# Patient Record
Sex: Female | Born: 1945 | Race: White | Hispanic: No | Smoking: Former smoker
Health system: Southern US, Community
[De-identification: ages and names within clinical notes are randomized; demographics above are authoritative.]

## PROBLEM LIST (undated history)

## (undated) DIAGNOSIS — I10 Essential (primary) hypertension: Secondary | ICD-10-CM

## (undated) DIAGNOSIS — C801 Malignant (primary) neoplasm, unspecified: Secondary | ICD-10-CM

## (undated) DIAGNOSIS — I2510 Atherosclerotic heart disease of native coronary artery without angina pectoris: Secondary | ICD-10-CM

## (undated) DIAGNOSIS — J449 Chronic obstructive pulmonary disease, unspecified: Secondary | ICD-10-CM

## (undated) HISTORY — PX: ABDOMINAL HYSTERECTOMY: SHX81

## (undated) HISTORY — PX: EYE SURGERY: SHX253

## (undated) HISTORY — PX: TONSILLECTOMY: SUR1361

## (undated) HISTORY — PX: VULVECTOMY PARTIAL: SHX6187

## (undated) HISTORY — PX: COLONOSCOPY: SHX174

## (undated) HISTORY — PX: KNEE ARTHROSCOPY: SHX127

## (undated) HISTORY — PX: TUBAL LIGATION: SHX77

## (undated) HISTORY — PX: ESOPHAGOGASTRODUODENOSCOPY: SHX1529

---

## 2004-08-17 ENCOUNTER — Ambulatory Visit: Payer: Self-pay | Admitting: Internal Medicine

## 2005-08-24 ENCOUNTER — Ambulatory Visit: Payer: Self-pay | Admitting: Internal Medicine

## 2006-08-25 ENCOUNTER — Ambulatory Visit: Payer: Self-pay | Admitting: Internal Medicine

## 2007-08-31 ENCOUNTER — Ambulatory Visit: Payer: Self-pay | Admitting: Internal Medicine

## 2008-06-02 ENCOUNTER — Ambulatory Visit: Payer: Self-pay | Admitting: Orthopedic Surgery

## 2008-08-15 ENCOUNTER — Ambulatory Visit: Payer: Self-pay | Admitting: Orthopedic Surgery

## 2008-08-15 ENCOUNTER — Ambulatory Visit: Payer: Self-pay | Admitting: Cardiology

## 2008-09-09 ENCOUNTER — Ambulatory Visit: Payer: Self-pay | Admitting: Internal Medicine

## 2008-09-18 ENCOUNTER — Ambulatory Visit: Payer: Self-pay | Admitting: Orthopedic Surgery

## 2008-12-04 ENCOUNTER — Ambulatory Visit: Payer: Self-pay | Admitting: Orthopedic Surgery

## 2010-03-10 ENCOUNTER — Ambulatory Visit: Payer: Self-pay | Admitting: Internal Medicine

## 2010-03-10 ENCOUNTER — Ambulatory Visit: Payer: Self-pay | Admitting: Gynecologic Oncology

## 2010-03-30 ENCOUNTER — Ambulatory Visit: Payer: Self-pay | Admitting: Gynecologic Oncology

## 2010-04-09 ENCOUNTER — Ambulatory Visit: Payer: Self-pay | Admitting: Gynecologic Oncology

## 2011-05-10 ENCOUNTER — Ambulatory Visit: Payer: Self-pay | Admitting: Gynecologic Oncology

## 2011-05-11 ENCOUNTER — Ambulatory Visit: Payer: Self-pay | Admitting: Gynecologic Oncology

## 2011-07-20 ENCOUNTER — Ambulatory Visit: Payer: Self-pay | Admitting: Internal Medicine

## 2012-05-22 ENCOUNTER — Ambulatory Visit: Payer: Self-pay | Admitting: Gynecologic Oncology

## 2012-06-10 ENCOUNTER — Ambulatory Visit: Payer: Self-pay | Admitting: Gynecologic Oncology

## 2012-08-14 ENCOUNTER — Ambulatory Visit: Payer: Self-pay | Admitting: Internal Medicine

## 2012-12-08 ENCOUNTER — Ambulatory Visit: Payer: Self-pay | Admitting: Gynecologic Oncology

## 2012-12-28 ENCOUNTER — Ambulatory Visit: Payer: Self-pay | Admitting: Unknown Physician Specialty

## 2013-01-01 LAB — PATHOLOGY REPORT

## 2013-01-08 ENCOUNTER — Ambulatory Visit: Payer: Self-pay | Admitting: Gynecologic Oncology

## 2013-05-23 ENCOUNTER — Ambulatory Visit: Payer: Self-pay | Admitting: Gynecologic Oncology

## 2013-06-11 ENCOUNTER — Ambulatory Visit: Payer: Self-pay | Admitting: Hematology and Oncology

## 2013-07-10 ENCOUNTER — Emergency Department: Payer: Self-pay | Admitting: Emergency Medicine

## 2013-07-10 ENCOUNTER — Ambulatory Visit: Payer: Self-pay | Admitting: Hematology and Oncology

## 2013-07-10 LAB — BASIC METABOLIC PANEL
BUN: 8 mg/dL (ref 7–18)
Co2: 29 mmol/L (ref 21–32)
Creatinine: 0.69 mg/dL (ref 0.60–1.30)
EGFR (African American): >60
Glucose: 135 mg/dL — ABNORMAL HIGH (ref 65–99)
Potassium: 3.5 mmol/L (ref 3.5–5.1)
Sodium: 132 mmol/L — ABNORMAL LOW (ref 136–145)

## 2013-07-10 LAB — CBC
HGB: 15.1 g/dL (ref 12.0–16.0)
MCH: 30.9 pg (ref 26.0–34.0)
MCHC: 34.6 g/dL (ref 32.0–36.0)
Platelet: 332 10*3/uL (ref 150–440)
RBC: 4.91 10*6/uL (ref 3.80–5.20)
WBC: 10 10*3/uL (ref 3.6–11.0)

## 2013-07-10 LAB — CK TOTAL AND CKMB (NOT AT ARMC): CK-MB: <0.5 ng/mL — ABNORMAL LOW (ref 0.5–3.6)

## 2013-08-02 ENCOUNTER — Ambulatory Visit: Payer: Self-pay | Admitting: Internal Medicine

## 2013-09-17 ENCOUNTER — Ambulatory Visit: Payer: Self-pay | Admitting: Internal Medicine

## 2014-01-18 DIAGNOSIS — F419 Anxiety disorder, unspecified: Secondary | ICD-10-CM | POA: Insufficient documentation

## 2014-01-18 DIAGNOSIS — C52 Malignant neoplasm of vagina: Secondary | ICD-10-CM | POA: Insufficient documentation

## 2014-01-18 DIAGNOSIS — I10 Essential (primary) hypertension: Secondary | ICD-10-CM | POA: Insufficient documentation

## 2014-01-18 DIAGNOSIS — R739 Hyperglycemia, unspecified: Secondary | ICD-10-CM | POA: Insufficient documentation

## 2014-02-26 DIAGNOSIS — M1990 Unspecified osteoarthritis, unspecified site: Secondary | ICD-10-CM | POA: Insufficient documentation

## 2014-06-10 ENCOUNTER — Ambulatory Visit: Payer: Self-pay | Admitting: Family Medicine

## 2014-07-10 ENCOUNTER — Ambulatory Visit: Payer: Self-pay | Admitting: Family Medicine

## 2014-11-18 ENCOUNTER — Ambulatory Visit: Payer: Self-pay | Admitting: Internal Medicine

## 2014-11-18 DIAGNOSIS — Z1231 Encounter for screening mammogram for malignant neoplasm of breast: Secondary | ICD-10-CM | POA: Diagnosis not present

## 2014-11-18 DIAGNOSIS — R928 Other abnormal and inconclusive findings on diagnostic imaging of breast: Secondary | ICD-10-CM | POA: Diagnosis not present

## 2014-11-19 ENCOUNTER — Ambulatory Visit: Payer: Self-pay | Admitting: Internal Medicine

## 2014-11-19 DIAGNOSIS — N63 Unspecified lump in breast: Secondary | ICD-10-CM | POA: Diagnosis not present

## 2014-11-19 DIAGNOSIS — R922 Inconclusive mammogram: Secondary | ICD-10-CM | POA: Diagnosis not present

## 2014-11-19 DIAGNOSIS — R928 Other abnormal and inconclusive findings on diagnostic imaging of breast: Secondary | ICD-10-CM | POA: Diagnosis not present

## 2015-01-09 DIAGNOSIS — M4697 Unspecified inflammatory spondylopathy, lumbosacral region: Secondary | ICD-10-CM | POA: Diagnosis not present

## 2015-01-09 DIAGNOSIS — M545 Low back pain: Secondary | ICD-10-CM | POA: Diagnosis not present

## 2015-01-13 DIAGNOSIS — M461 Sacroiliitis, not elsewhere classified: Secondary | ICD-10-CM | POA: Diagnosis not present

## 2015-01-13 DIAGNOSIS — M1991 Primary osteoarthritis, unspecified site: Secondary | ICD-10-CM | POA: Diagnosis not present

## 2015-01-13 DIAGNOSIS — M545 Low back pain: Secondary | ICD-10-CM | POA: Diagnosis not present

## 2015-01-15 DIAGNOSIS — M461 Sacroiliitis, not elsewhere classified: Secondary | ICD-10-CM | POA: Diagnosis not present

## 2015-01-15 DIAGNOSIS — M1991 Primary osteoarthritis, unspecified site: Secondary | ICD-10-CM | POA: Diagnosis not present

## 2015-01-15 DIAGNOSIS — M545 Low back pain: Secondary | ICD-10-CM | POA: Diagnosis not present

## 2015-01-20 DIAGNOSIS — M1991 Primary osteoarthritis, unspecified site: Secondary | ICD-10-CM | POA: Diagnosis not present

## 2015-01-20 DIAGNOSIS — M461 Sacroiliitis, not elsewhere classified: Secondary | ICD-10-CM | POA: Diagnosis not present

## 2015-01-20 DIAGNOSIS — I10 Essential (primary) hypertension: Secondary | ICD-10-CM | POA: Diagnosis not present

## 2015-01-20 DIAGNOSIS — M545 Low back pain: Secondary | ICD-10-CM | POA: Diagnosis not present

## 2015-01-22 DIAGNOSIS — M461 Sacroiliitis, not elsewhere classified: Secondary | ICD-10-CM | POA: Diagnosis not present

## 2015-01-22 DIAGNOSIS — M545 Low back pain: Secondary | ICD-10-CM | POA: Diagnosis not present

## 2015-01-22 DIAGNOSIS — M1991 Primary osteoarthritis, unspecified site: Secondary | ICD-10-CM | POA: Diagnosis not present

## 2015-01-27 DIAGNOSIS — M461 Sacroiliitis, not elsewhere classified: Secondary | ICD-10-CM | POA: Diagnosis not present

## 2015-01-27 DIAGNOSIS — M545 Low back pain: Secondary | ICD-10-CM | POA: Diagnosis not present

## 2015-01-27 DIAGNOSIS — M1991 Primary osteoarthritis, unspecified site: Secondary | ICD-10-CM | POA: Diagnosis not present

## 2015-01-29 DIAGNOSIS — M461 Sacroiliitis, not elsewhere classified: Secondary | ICD-10-CM | POA: Diagnosis not present

## 2015-01-29 DIAGNOSIS — M545 Low back pain: Secondary | ICD-10-CM | POA: Diagnosis not present

## 2015-01-29 DIAGNOSIS — M1991 Primary osteoarthritis, unspecified site: Secondary | ICD-10-CM | POA: Diagnosis not present

## 2015-02-03 DIAGNOSIS — M545 Low back pain: Secondary | ICD-10-CM | POA: Diagnosis not present

## 2015-02-03 DIAGNOSIS — M461 Sacroiliitis, not elsewhere classified: Secondary | ICD-10-CM | POA: Diagnosis not present

## 2015-02-03 DIAGNOSIS — M1991 Primary osteoarthritis, unspecified site: Secondary | ICD-10-CM | POA: Diagnosis not present

## 2015-02-05 DIAGNOSIS — M545 Low back pain: Secondary | ICD-10-CM | POA: Diagnosis not present

## 2015-02-05 DIAGNOSIS — M461 Sacroiliitis, not elsewhere classified: Secondary | ICD-10-CM | POA: Diagnosis not present

## 2015-02-05 DIAGNOSIS — M1991 Primary osteoarthritis, unspecified site: Secondary | ICD-10-CM | POA: Diagnosis not present

## 2015-02-09 DIAGNOSIS — M545 Low back pain: Secondary | ICD-10-CM | POA: Diagnosis not present

## 2015-02-09 DIAGNOSIS — M461 Sacroiliitis, not elsewhere classified: Secondary | ICD-10-CM | POA: Diagnosis not present

## 2015-02-09 DIAGNOSIS — M1991 Primary osteoarthritis, unspecified site: Secondary | ICD-10-CM | POA: Diagnosis not present

## 2015-02-11 DIAGNOSIS — M545 Low back pain: Secondary | ICD-10-CM | POA: Diagnosis not present

## 2015-02-11 DIAGNOSIS — M1991 Primary osteoarthritis, unspecified site: Secondary | ICD-10-CM | POA: Diagnosis not present

## 2015-02-11 DIAGNOSIS — M461 Sacroiliitis, not elsewhere classified: Secondary | ICD-10-CM | POA: Diagnosis not present

## 2015-02-17 DIAGNOSIS — M1991 Primary osteoarthritis, unspecified site: Secondary | ICD-10-CM | POA: Diagnosis not present

## 2015-02-17 DIAGNOSIS — M461 Sacroiliitis, not elsewhere classified: Secondary | ICD-10-CM | POA: Diagnosis not present

## 2015-02-17 DIAGNOSIS — M545 Low back pain: Secondary | ICD-10-CM | POA: Diagnosis not present

## 2015-02-19 DIAGNOSIS — M545 Low back pain: Secondary | ICD-10-CM | POA: Diagnosis not present

## 2015-02-19 DIAGNOSIS — M461 Sacroiliitis, not elsewhere classified: Secondary | ICD-10-CM | POA: Diagnosis not present

## 2015-02-19 DIAGNOSIS — M1991 Primary osteoarthritis, unspecified site: Secondary | ICD-10-CM | POA: Diagnosis not present

## 2015-02-24 DIAGNOSIS — M461 Sacroiliitis, not elsewhere classified: Secondary | ICD-10-CM | POA: Diagnosis not present

## 2015-02-24 DIAGNOSIS — M1991 Primary osteoarthritis, unspecified site: Secondary | ICD-10-CM | POA: Diagnosis not present

## 2015-02-24 DIAGNOSIS — M545 Low back pain: Secondary | ICD-10-CM | POA: Diagnosis not present

## 2015-02-26 DIAGNOSIS — R739 Hyperglycemia, unspecified: Secondary | ICD-10-CM | POA: Diagnosis not present

## 2015-02-26 DIAGNOSIS — M461 Sacroiliitis, not elsewhere classified: Secondary | ICD-10-CM | POA: Diagnosis not present

## 2015-02-26 DIAGNOSIS — F419 Anxiety disorder, unspecified: Secondary | ICD-10-CM | POA: Diagnosis not present

## 2015-02-26 DIAGNOSIS — M1991 Primary osteoarthritis, unspecified site: Secondary | ICD-10-CM | POA: Diagnosis not present

## 2015-02-26 DIAGNOSIS — I10 Essential (primary) hypertension: Secondary | ICD-10-CM | POA: Diagnosis not present

## 2015-02-26 DIAGNOSIS — M545 Low back pain: Secondary | ICD-10-CM | POA: Diagnosis not present

## 2015-02-26 DIAGNOSIS — J42 Unspecified chronic bronchitis: Secondary | ICD-10-CM | POA: Diagnosis not present

## 2015-08-20 DIAGNOSIS — R739 Hyperglycemia, unspecified: Secondary | ICD-10-CM | POA: Diagnosis not present

## 2015-08-20 DIAGNOSIS — I10 Essential (primary) hypertension: Secondary | ICD-10-CM | POA: Diagnosis not present

## 2015-08-27 DIAGNOSIS — Z0000 Encounter for general adult medical examination without abnormal findings: Secondary | ICD-10-CM | POA: Diagnosis not present

## 2015-08-27 DIAGNOSIS — Z1231 Encounter for screening mammogram for malignant neoplasm of breast: Secondary | ICD-10-CM | POA: Diagnosis not present

## 2015-08-27 DIAGNOSIS — I10 Essential (primary) hypertension: Secondary | ICD-10-CM | POA: Diagnosis not present

## 2015-08-27 DIAGNOSIS — J42 Unspecified chronic bronchitis: Secondary | ICD-10-CM | POA: Diagnosis not present

## 2015-08-27 DIAGNOSIS — Z23 Encounter for immunization: Secondary | ICD-10-CM | POA: Diagnosis not present

## 2015-08-27 DIAGNOSIS — R739 Hyperglycemia, unspecified: Secondary | ICD-10-CM | POA: Diagnosis not present

## 2016-02-18 DIAGNOSIS — I10 Essential (primary) hypertension: Secondary | ICD-10-CM | POA: Diagnosis not present

## 2016-02-18 DIAGNOSIS — R739 Hyperglycemia, unspecified: Secondary | ICD-10-CM | POA: Diagnosis not present

## 2016-02-24 DIAGNOSIS — I10 Essential (primary) hypertension: Secondary | ICD-10-CM | POA: Diagnosis not present

## 2016-02-24 DIAGNOSIS — R739 Hyperglycemia, unspecified: Secondary | ICD-10-CM | POA: Diagnosis not present

## 2016-02-24 DIAGNOSIS — Z1231 Encounter for screening mammogram for malignant neoplasm of breast: Secondary | ICD-10-CM | POA: Diagnosis not present

## 2016-02-24 DIAGNOSIS — M1990 Unspecified osteoarthritis, unspecified site: Secondary | ICD-10-CM | POA: Diagnosis not present

## 2016-02-24 DIAGNOSIS — J42 Unspecified chronic bronchitis: Secondary | ICD-10-CM | POA: Diagnosis not present

## 2016-03-15 ENCOUNTER — Other Ambulatory Visit: Payer: Self-pay | Admitting: Internal Medicine

## 2016-03-15 DIAGNOSIS — Z1231 Encounter for screening mammogram for malignant neoplasm of breast: Secondary | ICD-10-CM

## 2016-03-23 ENCOUNTER — Ambulatory Visit
Admission: RE | Admit: 2016-03-23 | Discharge: 2016-03-23 | Disposition: A | Discharge status: Home / Self Care | Payer: Commercial Managed Care - HMO | Source: Ambulatory Visit | Attending: Internal Medicine | Admitting: Internal Medicine

## 2016-03-23 DIAGNOSIS — Z1231 Encounter for screening mammogram for malignant neoplasm of breast: Secondary | ICD-10-CM | POA: Insufficient documentation

## 2016-06-29 DIAGNOSIS — Z23 Encounter for immunization: Secondary | ICD-10-CM | POA: Diagnosis not present

## 2016-06-29 DIAGNOSIS — M79632 Pain in left forearm: Secondary | ICD-10-CM | POA: Diagnosis not present

## 2016-06-29 DIAGNOSIS — M67431 Ganglion, right wrist: Secondary | ICD-10-CM | POA: Diagnosis not present

## 2016-08-31 DIAGNOSIS — L821 Other seborrheic keratosis: Secondary | ICD-10-CM | POA: Diagnosis not present

## 2016-08-31 DIAGNOSIS — J42 Unspecified chronic bronchitis: Secondary | ICD-10-CM | POA: Diagnosis not present

## 2016-08-31 DIAGNOSIS — Z0000 Encounter for general adult medical examination without abnormal findings: Secondary | ICD-10-CM | POA: Diagnosis not present

## 2016-08-31 DIAGNOSIS — I10 Essential (primary) hypertension: Secondary | ICD-10-CM | POA: Diagnosis not present

## 2016-08-31 DIAGNOSIS — E7800 Pure hypercholesterolemia, unspecified: Secondary | ICD-10-CM | POA: Diagnosis not present

## 2016-09-09 ENCOUNTER — Telehealth: Payer: Self-pay | Admitting: *Deleted

## 2016-09-09 DIAGNOSIS — E7800 Pure hypercholesterolemia, unspecified: Secondary | ICD-10-CM | POA: Diagnosis not present

## 2016-09-09 DIAGNOSIS — Z0000 Encounter for general adult medical examination without abnormal findings: Secondary | ICD-10-CM | POA: Diagnosis not present

## 2016-09-09 DIAGNOSIS — I10 Essential (primary) hypertension: Secondary | ICD-10-CM | POA: Diagnosis not present

## 2016-09-09 DIAGNOSIS — R7309 Other abnormal glucose: Secondary | ICD-10-CM | POA: Diagnosis not present

## 2016-09-09 NOTE — Telephone Encounter (Signed)
Received referral for initial lung cancer screening scan. Contacted patient and obtained smoking history,(former, quit 03/2011, 50 pack year) as well as answering questions related to screening process. Patient denies signs of lung cancer such as weight loss or hemoptysis. Patient denies comorbidity that would prevent curative treatment if lung cancer were found. Patient is tentatively scheduled for shared decision making visit and CT scan on 09/20/16 at 1:30pm, pending insurance approval from business office.

## 2016-09-19 ENCOUNTER — Other Ambulatory Visit: Payer: Self-pay | Admitting: *Deleted

## 2016-09-19 DIAGNOSIS — Z87891 Personal history of nicotine dependence: Secondary | ICD-10-CM

## 2016-09-20 ENCOUNTER — Encounter: Payer: Self-pay | Admitting: Oncology

## 2016-09-20 ENCOUNTER — Ambulatory Visit: Payer: Commercial Managed Care - HMO | Admitting: Oncology

## 2016-09-20 ENCOUNTER — Inpatient Hospital Stay: Payer: Commercial Managed Care - HMO | Attending: Oncology | Admitting: Oncology

## 2016-09-20 ENCOUNTER — Ambulatory Visit
Admission: RE | Admit: 2016-09-20 | Discharge: 2016-09-20 | Disposition: A | Discharge status: Home / Self Care | Payer: Commercial Managed Care - HMO | Source: Ambulatory Visit | Attending: Oncology | Admitting: Oncology

## 2016-09-20 DIAGNOSIS — Z87891 Personal history of nicotine dependence: Secondary | ICD-10-CM | POA: Insufficient documentation

## 2016-09-20 DIAGNOSIS — Z122 Encounter for screening for malignant neoplasm of respiratory organs: Secondary | ICD-10-CM

## 2016-09-21 ENCOUNTER — Encounter: Payer: Self-pay | Admitting: *Deleted

## 2016-09-23 DIAGNOSIS — Z87891 Personal history of nicotine dependence: Secondary | ICD-10-CM | POA: Insufficient documentation

## 2016-09-23 NOTE — Progress Notes (Signed)
In accordance with CMS guidelines, patient has met eligibility criteria including age, absence of signs or symptoms of lung cancer.  Social History  Substance Use Topics  . Smoking status: Former Smoker    Packs/day: 1.00    Years: 50.00    Types: Cigarettes    Quit date: 2012  . Smokeless tobacco: Not on file  . Alcohol use Not on file     A shared decision-making session was conducted prior to the performance of CT scan. This includes one or more decision aids, includes benefits and harms of screening, follow-up diagnostic testing, over-diagnosis, false positive rate, and total radiation exposure.  Counseling on the importance of adherence to annual lung cancer LDCT screening, impact of co-morbidities, and ability or willingness to undergo diagnosis and treatment is imperative for compliance of the program.  Counseling on the importance of continued smoking cessation for former smokers; the importance of smoking cessation for current smokers, and information about tobacco cessation interventions have been given to patient including [facility] and [facility] quit [place] programs.  Written order for lung cancer screening with LDCT has been given to the patient and any and all questions have been answered to the best of my abilities.   Yearly follow up will be coordinated by Burgess Estelle, Thoracic Navigator.

## 2016-10-06 DIAGNOSIS — L570 Actinic keratosis: Secondary | ICD-10-CM | POA: Diagnosis not present

## 2016-10-06 DIAGNOSIS — X32XXXA Exposure to sunlight, initial encounter: Secondary | ICD-10-CM | POA: Diagnosis not present

## 2016-10-06 DIAGNOSIS — L821 Other seborrheic keratosis: Secondary | ICD-10-CM | POA: Diagnosis not present

## 2016-10-06 DIAGNOSIS — Z08 Encounter for follow-up examination after completed treatment for malignant neoplasm: Secondary | ICD-10-CM | POA: Diagnosis not present

## 2016-10-06 DIAGNOSIS — Z85828 Personal history of other malignant neoplasm of skin: Secondary | ICD-10-CM | POA: Diagnosis not present

## 2016-11-15 DIAGNOSIS — R7309 Other abnormal glucose: Secondary | ICD-10-CM | POA: Insufficient documentation

## 2016-11-15 DIAGNOSIS — I2510 Atherosclerotic heart disease of native coronary artery without angina pectoris: Secondary | ICD-10-CM | POA: Diagnosis not present

## 2017-02-22 DIAGNOSIS — I2510 Atherosclerotic heart disease of native coronary artery without angina pectoris: Secondary | ICD-10-CM | POA: Diagnosis not present

## 2017-02-22 DIAGNOSIS — R7309 Other abnormal glucose: Secondary | ICD-10-CM | POA: Diagnosis not present

## 2017-03-01 DIAGNOSIS — J42 Unspecified chronic bronchitis: Secondary | ICD-10-CM | POA: Diagnosis not present

## 2017-03-01 DIAGNOSIS — R202 Paresthesia of skin: Secondary | ICD-10-CM | POA: Diagnosis not present

## 2017-03-01 DIAGNOSIS — I2510 Atherosclerotic heart disease of native coronary artery without angina pectoris: Secondary | ICD-10-CM | POA: Diagnosis not present

## 2017-03-01 DIAGNOSIS — C52 Malignant neoplasm of vagina: Secondary | ICD-10-CM | POA: Diagnosis not present

## 2017-03-01 DIAGNOSIS — R7309 Other abnormal glucose: Secondary | ICD-10-CM | POA: Diagnosis not present

## 2017-03-01 DIAGNOSIS — I10 Essential (primary) hypertension: Secondary | ICD-10-CM | POA: Diagnosis not present

## 2017-04-06 DIAGNOSIS — D2272 Melanocytic nevi of left lower limb, including hip: Secondary | ICD-10-CM | POA: Diagnosis not present

## 2017-04-06 DIAGNOSIS — D2262 Melanocytic nevi of left upper limb, including shoulder: Secondary | ICD-10-CM | POA: Diagnosis not present

## 2017-04-06 DIAGNOSIS — D2271 Melanocytic nevi of right lower limb, including hip: Secondary | ICD-10-CM | POA: Diagnosis not present

## 2017-04-06 DIAGNOSIS — D2261 Melanocytic nevi of right upper limb, including shoulder: Secondary | ICD-10-CM | POA: Diagnosis not present

## 2017-04-07 ENCOUNTER — Other Ambulatory Visit: Payer: Self-pay | Admitting: Internal Medicine

## 2017-04-07 DIAGNOSIS — Z1231 Encounter for screening mammogram for malignant neoplasm of breast: Secondary | ICD-10-CM

## 2017-04-19 DIAGNOSIS — M898X6 Other specified disorders of bone, lower leg: Secondary | ICD-10-CM | POA: Diagnosis not present

## 2017-04-19 DIAGNOSIS — M2391 Unspecified internal derangement of right knee: Secondary | ICD-10-CM | POA: Diagnosis not present

## 2017-04-19 DIAGNOSIS — M5441 Lumbago with sciatica, right side: Secondary | ICD-10-CM | POA: Diagnosis not present

## 2017-05-02 ENCOUNTER — Ambulatory Visit
Admission: RE | Admit: 2017-05-02 | Discharge: 2017-05-02 | Disposition: A | Discharge status: Home / Self Care | Payer: Commercial Managed Care - HMO | Source: Ambulatory Visit | Attending: Internal Medicine | Admitting: Internal Medicine

## 2017-05-02 DIAGNOSIS — Z1231 Encounter for screening mammogram for malignant neoplasm of breast: Secondary | ICD-10-CM | POA: Diagnosis not present

## 2017-08-29 DIAGNOSIS — R202 Paresthesia of skin: Secondary | ICD-10-CM | POA: Diagnosis not present

## 2017-08-29 DIAGNOSIS — I10 Essential (primary) hypertension: Secondary | ICD-10-CM | POA: Diagnosis not present

## 2017-08-29 DIAGNOSIS — R7309 Other abnormal glucose: Secondary | ICD-10-CM | POA: Diagnosis not present

## 2017-08-29 DIAGNOSIS — I2510 Atherosclerotic heart disease of native coronary artery without angina pectoris: Secondary | ICD-10-CM | POA: Diagnosis not present

## 2017-09-05 DIAGNOSIS — R0609 Other forms of dyspnea: Secondary | ICD-10-CM | POA: Diagnosis not present

## 2017-09-05 DIAGNOSIS — M1612 Unilateral primary osteoarthritis, left hip: Secondary | ICD-10-CM | POA: Diagnosis not present

## 2017-09-05 DIAGNOSIS — J42 Unspecified chronic bronchitis: Secondary | ICD-10-CM | POA: Diagnosis not present

## 2017-09-05 DIAGNOSIS — I10 Essential (primary) hypertension: Secondary | ICD-10-CM | POA: Diagnosis not present

## 2017-09-05 DIAGNOSIS — R7309 Other abnormal glucose: Secondary | ICD-10-CM | POA: Diagnosis not present

## 2017-09-05 DIAGNOSIS — Z0000 Encounter for general adult medical examination without abnormal findings: Secondary | ICD-10-CM | POA: Diagnosis not present

## 2017-09-05 DIAGNOSIS — I2510 Atherosclerotic heart disease of native coronary artery without angina pectoris: Secondary | ICD-10-CM | POA: Diagnosis not present

## 2017-09-07 DIAGNOSIS — M25551 Pain in right hip: Secondary | ICD-10-CM | POA: Diagnosis not present

## 2017-09-12 ENCOUNTER — Telehealth: Payer: Self-pay | Admitting: *Deleted

## 2017-09-12 DIAGNOSIS — Z122 Encounter for screening for malignant neoplasm of respiratory organs: Secondary | ICD-10-CM

## 2017-09-12 DIAGNOSIS — Z87891 Personal history of nicotine dependence: Secondary | ICD-10-CM

## 2017-09-12 NOTE — Telephone Encounter (Signed)
Notified patient that annual lung cancer screening low dose CT scan is due currently or will be in near future. Confirmed that patient is within the age range of 55-77, and asymptomatic, (no signs or symptoms of lung cancer). Patient denies illness that would prevent curative treatment for lung cancer if found. Verified smoking history, (former, quit 2012, 50 pack year). The shared decision making visit was done 09/20/16. Patient is agreeable for CT scan being scheduled.

## 2017-09-15 DIAGNOSIS — I2510 Atherosclerotic heart disease of native coronary artery without angina pectoris: Secondary | ICD-10-CM | POA: Diagnosis not present

## 2017-09-20 ENCOUNTER — Other Ambulatory Visit: Payer: Self-pay | Admitting: Orthopedic Surgery

## 2017-09-20 DIAGNOSIS — M9933 Osseous stenosis of neural canal of lumbar region: Secondary | ICD-10-CM

## 2017-09-21 ENCOUNTER — Ambulatory Visit
Admission: RE | Admit: 2017-09-21 | Discharge: 2017-09-21 | Disposition: A | Discharge status: Home / Self Care | Payer: Medicare HMO | Source: Ambulatory Visit | Attending: Oncology | Admitting: Oncology

## 2017-09-21 DIAGNOSIS — J9811 Atelectasis: Secondary | ICD-10-CM | POA: Diagnosis not present

## 2017-09-21 DIAGNOSIS — I700 Atherosclerosis of aorta: Secondary | ICD-10-CM | POA: Insufficient documentation

## 2017-09-21 DIAGNOSIS — I2510 Atherosclerotic heart disease of native coronary artery without angina pectoris: Secondary | ICD-10-CM | POA: Diagnosis not present

## 2017-09-21 DIAGNOSIS — J432 Centrilobular emphysema: Secondary | ICD-10-CM | POA: Diagnosis not present

## 2017-09-21 DIAGNOSIS — Z122 Encounter for screening for malignant neoplasm of respiratory organs: Secondary | ICD-10-CM | POA: Insufficient documentation

## 2017-09-21 DIAGNOSIS — Z87891 Personal history of nicotine dependence: Secondary | ICD-10-CM | POA: Diagnosis not present

## 2017-09-22 ENCOUNTER — Ambulatory Visit
Admission: RE | Admit: 2017-09-22 | Discharge: 2017-09-22 | Disposition: A | Discharge status: Home / Self Care | Payer: Medicare HMO | Source: Ambulatory Visit | Attending: Orthopedic Surgery | Admitting: Orthopedic Surgery

## 2017-09-22 DIAGNOSIS — M48061 Spinal stenosis, lumbar region without neurogenic claudication: Secondary | ICD-10-CM | POA: Diagnosis not present

## 2017-09-22 DIAGNOSIS — M5126 Other intervertebral disc displacement, lumbar region: Secondary | ICD-10-CM | POA: Insufficient documentation

## 2017-09-22 DIAGNOSIS — M9933 Osseous stenosis of neural canal of lumbar region: Secondary | ICD-10-CM | POA: Diagnosis not present

## 2017-09-22 DIAGNOSIS — M5136 Other intervertebral disc degeneration, lumbar region: Secondary | ICD-10-CM | POA: Insufficient documentation

## 2017-09-25 ENCOUNTER — Telehealth: Payer: Self-pay | Admitting: *Deleted

## 2017-09-25 NOTE — Telephone Encounter (Signed)
Notified patient of LDCT lung cancer screening program results with recommendation for 12 month follow up imaging. Also notified of incidental findings noted below and is encouraged to discuss further with PCP who will receive a copy of this note and/or the CT report. Patient verbalizes understanding.   IMPRESSION: 1. Lung-RADS 2, benign appearance or behavior. Continue annual screening with low-dose chest CT without contrast in 12 months. 2.  Emphysema. (ICD10-J43.9) 3. Coronary artery and Aortic Atherosclerois (ICD10-170.0)

## 2017-09-28 ENCOUNTER — Other Ambulatory Visit: Payer: Self-pay | Admitting: Orthopedic Surgery

## 2017-09-28 DIAGNOSIS — J438 Other emphysema: Secondary | ICD-10-CM | POA: Insufficient documentation

## 2017-09-28 DIAGNOSIS — I700 Atherosclerosis of aorta: Secondary | ICD-10-CM | POA: Insufficient documentation

## 2017-09-28 DIAGNOSIS — M48062 Spinal stenosis, lumbar region with neurogenic claudication: Secondary | ICD-10-CM

## 2017-10-16 ENCOUNTER — Ambulatory Visit
Admission: RE | Admit: 2017-10-16 | Discharge: 2017-10-16 | Disposition: A | Discharge status: Home / Self Care | Payer: Medicare HMO | Source: Ambulatory Visit | Attending: Orthopedic Surgery | Admitting: Orthopedic Surgery

## 2017-10-16 DIAGNOSIS — M48062 Spinal stenosis, lumbar region with neurogenic claudication: Secondary | ICD-10-CM

## 2017-10-16 MED ORDER — IOPAMIDOL (ISOVUE-M 200) INJECTION 41%
1.0000 mL | Freq: Once | INTRAMUSCULAR | Status: AC
  Administered 2017-10-16: 1 mL via EPIDURAL

## 2017-10-16 MED ORDER — METHYLPREDNISOLONE ACETATE 40 MG/ML INJ SUSP (RADIOLOG
120.0000 mg | Freq: Once | INTRAMUSCULAR | Status: AC
  Administered 2017-10-16: 120 mg via EPIDURAL

## 2017-10-16 NOTE — Discharge Instructions (Signed)

## 2018-02-20 IMAGING — XA Imaging study
2 series · 2 of 2 positions shown · non-contrast
Comparison: none

CLINICAL DATA: Spinal stenosis of lumbar region with neurogenic
claudication. Displacement of the L3-4, L4-5, and L5-S1 lumbar
discs.

[Series 1: ortho adipose · 1 of 1 slices shown (1 of 2)]
[im 1/1]
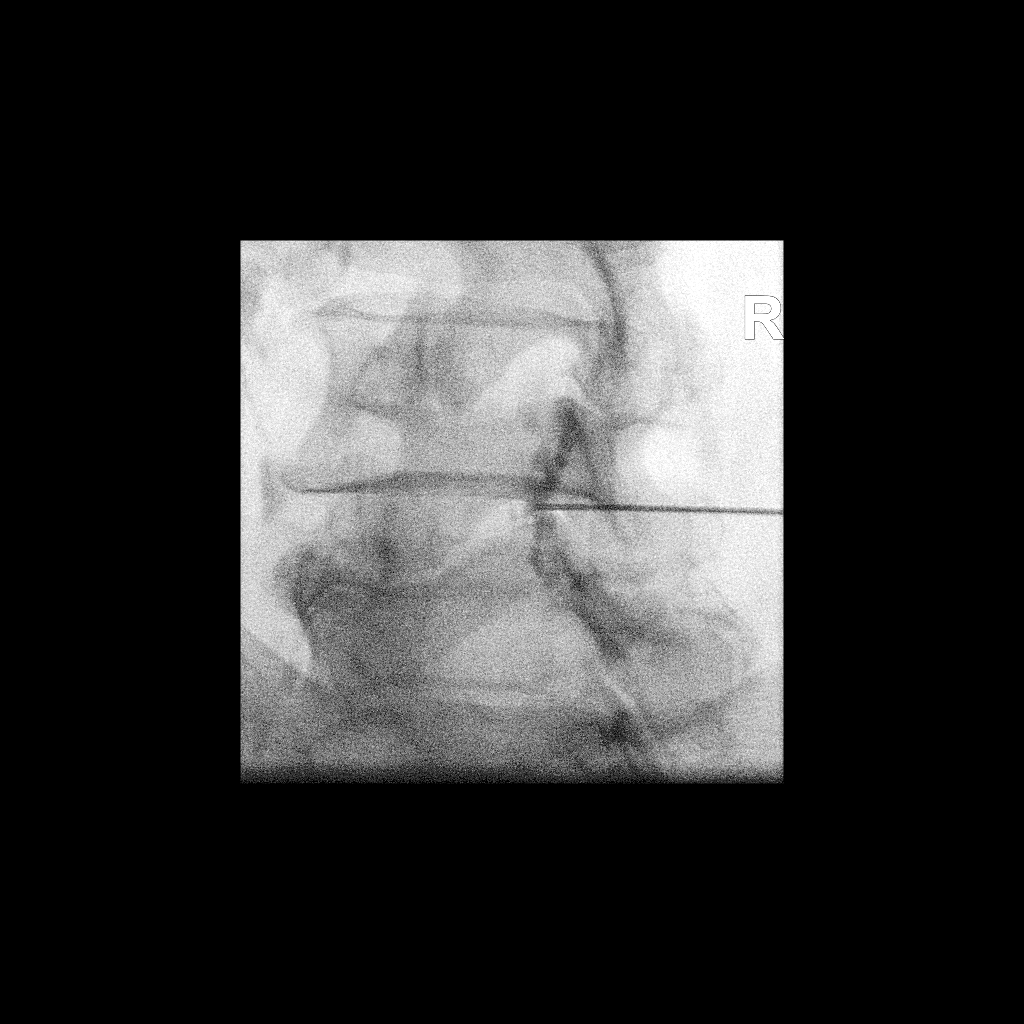

[Series 2: ortho adipose · 1 of 1 slices shown (2 of 2)]
[im 1/1]
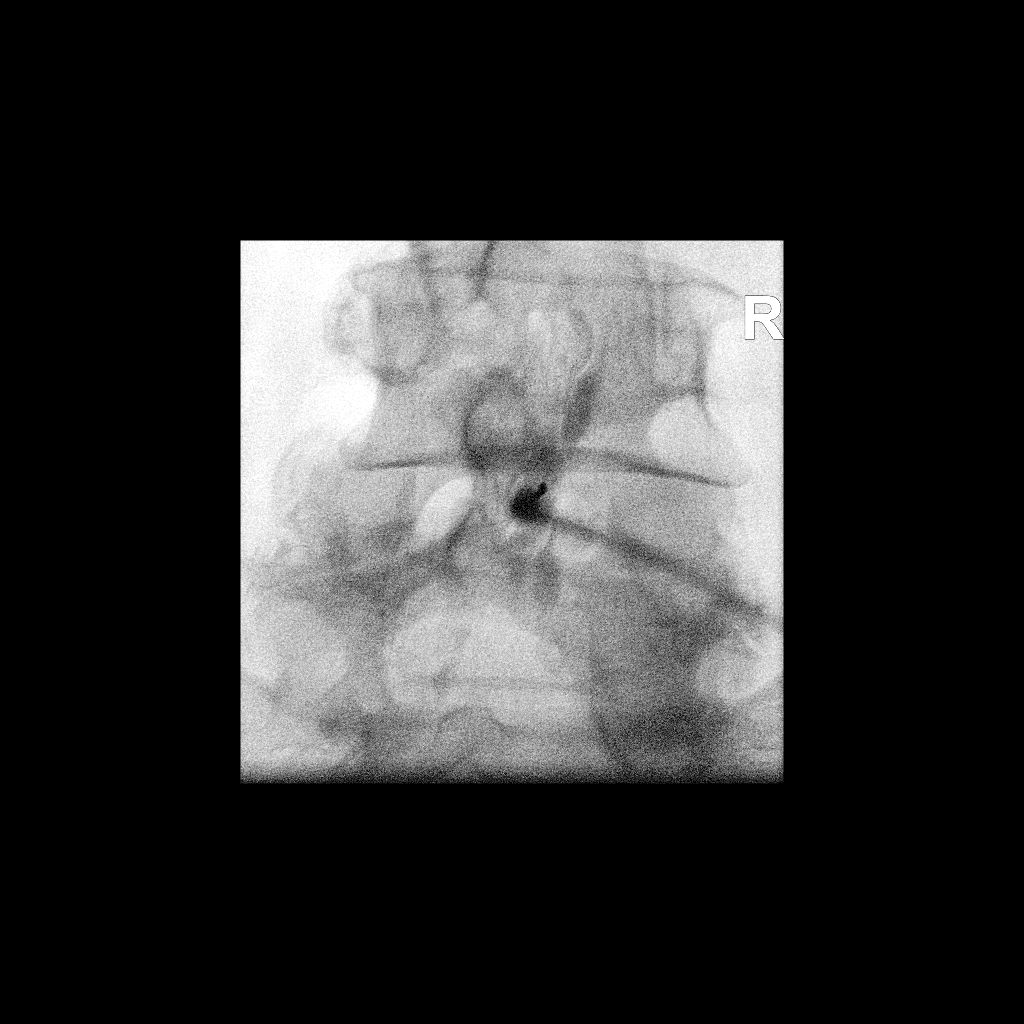

[2 of 2 positions shown; findings below may reference images not displayed]

FLUOROSCOPY TIME:  Radiation Exposure Index (as provided by the
fluoroscopic device): 16.95 uGy*m2

Fluoroscopy Time:  20 seconds

Number of Acquired Images:  0

PROCEDURE:
The procedure, risks, benefits, and alternatives were explained to
the patient. Questions regarding the procedure were encouraged and
answered. The patient understands and consents to the procedure.

LUMBAR EPIDURAL INJECTION:

An interlaminar approach was performed on right at L4-5. The
overlying skin was cleansed and anesthetized. A 20 gauge epidural
needle was advanced using loss-of-resistance technique.

DIAGNOSTIC EPIDURAL INJECTION:

Injection of Isovue-M 200 shows a good epidural pattern with spread
above and below the level of needle placement, primarily on the
right no vascular opacification is seen.

THERAPEUTIC EPIDURAL INJECTION:

120 Mg of Depo-Medrol mixed with 3 mL 1% lidocaine were instilled.
The procedure was well-tolerated, and the patient was discharged
thirty minutes following the injection in good condition.

COMPLICATIONS:
None
IMPRESSION: Technically successful epidural injection on the right L4-5 # 1

## 2018-06-08 ENCOUNTER — Other Ambulatory Visit: Payer: Self-pay | Admitting: Internal Medicine

## 2018-06-08 DIAGNOSIS — Z1231 Encounter for screening mammogram for malignant neoplasm of breast: Secondary | ICD-10-CM

## 2018-06-25 ENCOUNTER — Ambulatory Visit
Admission: RE | Admit: 2018-06-25 | Discharge: 2018-06-25 | Disposition: A | Discharge status: Home / Self Care | Payer: Medicare HMO | Source: Ambulatory Visit | Attending: Internal Medicine | Admitting: Internal Medicine

## 2018-06-25 DIAGNOSIS — Z1231 Encounter for screening mammogram for malignant neoplasm of breast: Secondary | ICD-10-CM | POA: Insufficient documentation

## 2018-06-27 ENCOUNTER — Encounter
Admission: RE | Disposition: A | Discharge status: Home / Self Care | Payer: Self-pay | Source: Ambulatory Visit | Attending: Unknown Physician Specialty

## 2018-06-27 ENCOUNTER — Ambulatory Visit
Admission: RE | Admit: 2018-06-27 | Discharge: 2018-06-27 | Disposition: A | Discharge status: Home / Self Care | Payer: Medicare HMO | Source: Ambulatory Visit | Attending: Unknown Physician Specialty | Admitting: Unknown Physician Specialty

## 2018-06-27 ENCOUNTER — Encounter: Payer: Self-pay | Admitting: *Deleted

## 2018-06-27 ENCOUNTER — Ambulatory Visit: Payer: Medicare HMO | Admitting: Anesthesiology

## 2018-06-27 DIAGNOSIS — J449 Chronic obstructive pulmonary disease, unspecified: Secondary | ICD-10-CM | POA: Diagnosis not present

## 2018-06-27 DIAGNOSIS — Z7982 Long term (current) use of aspirin: Secondary | ICD-10-CM | POA: Diagnosis not present

## 2018-06-27 DIAGNOSIS — D122 Benign neoplasm of ascending colon: Secondary | ICD-10-CM | POA: Diagnosis not present

## 2018-06-27 DIAGNOSIS — K635 Polyp of colon: Secondary | ICD-10-CM | POA: Diagnosis not present

## 2018-06-27 DIAGNOSIS — I10 Essential (primary) hypertension: Secondary | ICD-10-CM | POA: Insufficient documentation

## 2018-06-27 DIAGNOSIS — D120 Benign neoplasm of cecum: Secondary | ICD-10-CM | POA: Diagnosis not present

## 2018-06-27 DIAGNOSIS — D123 Benign neoplasm of transverse colon: Secondary | ICD-10-CM | POA: Insufficient documentation

## 2018-06-27 DIAGNOSIS — K640 First degree hemorrhoids: Secondary | ICD-10-CM | POA: Diagnosis not present

## 2018-06-27 DIAGNOSIS — Z86010 Personal history of colonic polyps: Secondary | ICD-10-CM | POA: Insufficient documentation

## 2018-06-27 DIAGNOSIS — I2510 Atherosclerotic heart disease of native coronary artery without angina pectoris: Secondary | ICD-10-CM | POA: Diagnosis not present

## 2018-06-27 DIAGNOSIS — Z79899 Other long term (current) drug therapy: Secondary | ICD-10-CM | POA: Diagnosis not present

## 2018-06-27 DIAGNOSIS — Z87891 Personal history of nicotine dependence: Secondary | ICD-10-CM | POA: Diagnosis not present

## 2018-06-27 DIAGNOSIS — Z1211 Encounter for screening for malignant neoplasm of colon: Secondary | ICD-10-CM | POA: Diagnosis not present

## 2018-06-27 HISTORY — DX: Essential (primary) hypertension: I10

## 2018-06-27 HISTORY — DX: Chronic obstructive pulmonary disease, unspecified: J44.9

## 2018-06-27 HISTORY — PX: COLONOSCOPY WITH PROPOFOL: SHX5780

## 2018-06-27 HISTORY — DX: Atherosclerotic heart disease of native coronary artery without angina pectoris: I25.10

## 2018-06-27 SURGERY — COLONOSCOPY WITH PROPOFOL
Anesthesia: General

## 2018-06-27 MED ORDER — FENTANYL CITRATE (PF) 100 MCG/2ML IJ SOLN
INTRAMUSCULAR | Status: DC | PRN
  Administered 2018-06-27 (×2): 50 ug via INTRAVENOUS

## 2018-06-27 MED ORDER — FENTANYL CITRATE (PF) 100 MCG/2ML IJ SOLN
INTRAMUSCULAR | Status: AC
  Filled 2018-06-27: qty 2

## 2018-06-27 MED ORDER — PROPOFOL 500 MG/50ML IV EMUL
INTRAVENOUS | Status: DC | PRN
  Administered 2018-06-27: 50 ug/kg/min via INTRAVENOUS

## 2018-06-27 MED ORDER — MIDAZOLAM HCL 2 MG/2ML IJ SOLN
INTRAMUSCULAR | Status: AC
  Filled 2018-06-27: qty 2

## 2018-06-27 MED ORDER — LIDOCAINE HCL (PF) 1 % IJ SOLN
2.0000 mL | Freq: Once | INTRAMUSCULAR | Status: AC
  Administered 2018-06-27: 09:00:00 via INTRADERMAL

## 2018-06-27 MED ORDER — LIDOCAINE HCL (PF) 2 % IJ SOLN
INTRAMUSCULAR | Status: DC | PRN
  Administered 2018-06-27: 80 mg

## 2018-06-27 MED ORDER — MIDAZOLAM HCL 5 MG/5ML IJ SOLN
INTRAMUSCULAR | Status: DC | PRN
  Administered 2018-06-27: 2 mg via INTRAVENOUS

## 2018-06-27 MED ORDER — SODIUM CHLORIDE 0.9 % IV SOLN
INTRAVENOUS | Status: DC
  Administered 2018-06-27: 1000 mL via INTRAVENOUS

## 2018-06-27 MED ORDER — LIDOCAINE HCL (PF) 1 % IJ SOLN
INTRAMUSCULAR | Status: AC
  Filled 2018-06-27: qty 2

## 2018-06-27 MED ORDER — LIDOCAINE HCL (PF) 2 % IJ SOLN
INTRAMUSCULAR | Status: AC
  Filled 2018-06-27: qty 10

## 2018-06-27 MED ORDER — PROPOFOL 10 MG/ML IV BOLUS
INTRAVENOUS | Status: DC | PRN
  Administered 2018-06-27: 30 mg via INTRAVENOUS
  Administered 2018-06-27 (×2): 20 mg via INTRAVENOUS

## 2018-06-27 NOTE — Anesthesia Preprocedure Evaluation (Addendum)
Anesthesia Evaluation  Patient identified by MRN, date of birth, ID band Patient awake    Reviewed: Allergy & Precautions, NPO status , Patient's Chart, lab work & pertinent test results, reviewed documented beta blocker date and time   Airway Mallampati: III  TM Distance: >3 FB     Dental  (+) Upper Dentures, Lower Dentures   Pulmonary COPD, former smoker,           Cardiovascular hypertension, Pt. on medications + CAD and + Peripheral Vascular Disease       Neuro/Psych Anxiety    GI/Hepatic   Endo/Other    Renal/GU      Musculoskeletal  (+) Arthritis ,   Abdominal   Peds  Hematology   Anesthesia Other Findings Smokes.Obese.  Reproductive/Obstetrics                            Anesthesia Physical Anesthesia Plan  ASA: III  Anesthesia Plan: General   Post-op Pain Management:    Induction: Intravenous  PONV Risk Score and Plan:   Airway Management Planned:   Additional Equipment:   Intra-op Plan:   Post-operative Plan:   Informed Consent: I have reviewed the patients History and Physical, chart, labs and discussed the procedure including the risks, benefits and alternatives for the proposed anesthesia with the patient or authorized representative who has indicated his/her understanding and acceptance.     Plan Discussed with: CRNA  Anesthesia Plan Comments:         Anesthesia Quick Evaluation

## 2018-06-27 NOTE — Anesthesia Post-op Follow-up Note (Signed)
Anesthesia QCDR form completed.        

## 2018-06-27 NOTE — Transfer of Care (Signed)
Immediate Anesthesia Transfer of Care Note  Patient: Amy Holloway  Procedure(s) Performed: COLONOSCOPY WITH PROPOFOL (N/A )  Patient Location: PACU  Anesthesia Type:General  Level of Consciousness: awake, alert  and oriented  Airway & Oxygen Therapy: Patient Spontanous Breathing  Post-op Assessment: Report given to RN and Post -op Vital signs reviewed and stable  Post vital signs: Reviewed and stable  Last Vitals:  Vitals Value Taken Time  BP 100/67 06/27/2018  9:55 AM  Temp    Pulse 79 06/27/2018  9:55 AM  Resp 17 06/27/2018  9:55 AM  SpO2 98 % 06/27/2018  9:55 AM  Vitals shown include unvalidated device data.  Last Pain:  Vitals:   06/27/18 0955  TempSrc: (P) Tympanic  PainSc:          Complications: No apparent anesthesia complications

## 2018-06-27 NOTE — Anesthesia Postprocedure Evaluation (Signed)
Anesthesia Post Note  Patient: Amy Holloway  Procedure(s) Performed: COLONOSCOPY WITH PROPOFOL (N/A )  Patient location during evaluation: Endoscopy Anesthesia Type: General Level of consciousness: awake and alert Pain management: pain level controlled Vital Signs Assessment: post-procedure vital signs reviewed and stable Respiratory status: spontaneous breathing, nonlabored ventilation, respiratory function stable and patient connected to nasal cannula oxygen Cardiovascular status: blood pressure returned to baseline and stable Postop Assessment: no apparent nausea or vomiting Anesthetic complications: no     Last Vitals:  Vitals:   06/27/18 0859 06/27/18 0955  BP: (!) 151/72   Pulse: 86 74  Resp: 16 16  Temp: (!) 36.1 C (!) 36.2 C  SpO2: 99% 98%    Last Pain:  Vitals:   06/27/18 1026  TempSrc:   PainSc: 0-No pain                 Antowan Samford S

## 2018-06-27 NOTE — H&P (Signed)
Primary Care Physician:  Kirk Ruths, MD Primary Gastroenterologist:  Dr. Vira Agar  Pre-Procedure History & Physical: HPI:  Amy Holloway is a 72 y.o. female is here for an colonoscopy. This is being done for previous colonoscopy with polypectomy.   Past Medical History:  Diagnosis Date  . COPD (chronic obstructive pulmonary disease) ([place])   . Coronary artery disease   . Hypertension     Past Surgical History:  Procedure Laterality Date  . ABDOMINAL HYSTERECTOMY    . COLONOSCOPY    . ESOPHAGOGASTRODUODENOSCOPY    . EYE SURGERY    . KNEE ARTHROSCOPY Left   . TONSILLECTOMY    . TUBAL LIGATION    . VULVECTOMY PARTIAL      Prior to Admission medications   Medication Sig Start Date End Date Taking? Authorizing Provider  amLODipine (NORVASC) 10 MG tablet Take 10 mg by mouth daily.   Yes [provider]  aspirin 81 MG chewable tablet Chew 81 mg by mouth daily.   Yes [provider]    Allergies as of 05/07/2018  . (No Known Allergies)    Family History  Problem Relation Age of Onset  . Breast cancer Neg Hx     Social History   Socioeconomic History  . Marital status: Divorced    Spouse name: Not on file  . Number of children: Not on file  . Years of education: Not on file  . Highest education level: Not on file  Occupational History  . Not on file  Social Needs  . Financial resource strain: Not on file  . Food insecurity:    Worry: Not on file    Inability: Not on file  . Transportation needs:    Medical: Not on file    Non-medical: Not on file  Tobacco Use  . Smoking status: Former Smoker    Packs/day: 1.00    Years: 50.00    Pack years: 50.00    Types: Cigarettes    Last attempt to quit: 2012    Years since quitting: 7.7  Substance and Sexual Activity  . Alcohol use: Not on file  . Drug use: Not on file  . Sexual activity: Not on file  Lifestyle  . Physical activity:    Days per week: Not on file    Minutes per  session: Not on file  . Stress: Not on file  Relationships  . Social connections:    Talks on phone: Not on file    Gets together: Not on file    Attends religious service: Not on file    Active member of club or organization: Not on file    Attends meetings of clubs or organizations: Not on file    Relationship status: Not on file  . Intimate partner violence:    Fear of current or ex partner: Not on file    Emotionally abused: Not on file    Physically abused: Not on file    Forced sexual activity: Not on file  Other Topics Concern  . Not on file  Social History Narrative  . Not on file    Review of Systems: See HPI, otherwise negative ROS  Physical Exam: BP (!) 151/72   Pulse 86   Temp (!) 96.9 F (36.1 C) (Tympanic)   Resp 16   Ht 5\' 3"  (1.6 m)   Wt 88.5 kg   SpO2 99%   BMI 34.54 kg/m  General:   Alert,  pleasant and cooperative  in NAD Head:  Normocephalic and atraumatic. Neck:  Supple; no masses or thyromegaly. Lungs:  Clear throughout to auscultation.    Heart:  Regular rate and rhythm. Abdomen:  Soft, nontender and nondistended. Normal bowel sounds, without guarding, and without rebound.   Neurologic:  Alert and  oriented x4;  grossly normal neurologically.  Impression/Plan: Amy Holloway is here for an colonoscopy to be performed for [facility] colon polyps.  Risks, benefits, limitations, and alternatives regarding  colonoscopy have been reviewed with the patient.  Questions have been answered.  All parties agreeable.   Gaylyn Cheers, MD  06/27/2018, 9:20 AM

## 2018-06-27 NOTE — Op Note (Signed)
[facility] Patient Name: Amy Holloway Procedure Date: 06/27/2018 9:08 AM MRN: [id number] Account #: [id number] Date of Birth: 01/03/1946 Admit Type: Outpatient Age: 72 Room: [facility] ENDO ROOM 3 Gender: Female Note Status: Finalized Procedure:            Colonoscopy Indications:          High risk colon cancer surveillance: Personal history                        of colonic polyps Providers:            Manya Silvas, MD Referring MD:         Ocie Cornfield. Ouida Sills MD, MD (Referring MD) Medicines:            Propofol per Anesthesia Complications:        No immediate complications. Procedure:            Pre-Anesthesia Assessment:                       - After reviewing the risks and benefits, the patient                        was deemed in satisfactory condition to undergo the                        procedure.                       After obtaining informed consent, the colonoscope was                        passed under direct vision. Throughout the procedure,                        the patient's blood pressure, pulse, and oxygen                        saturations were monitored continuously. The                        Colonoscope was introduced through the anus and                        advanced to the the cecum, identified by appendiceal                        orifice and ileocecal valve. The colonoscopy was                        performed without difficulty. The patient tolerated the                        procedure well. The quality of the bowel preparation                        was excellent. Findings:      A diminutive polyp was found in the hepatic flexure. The polyp was       sessile. The polyp was removed with a jumbo cold forceps. Resection and       retrieval were complete.      A diminutive polyp  was found in the cecum. The polyp was sessile. The       polyp was removed with a jumbo cold forceps. Resection and retrieval        were complete.      A diminutive polyp was found in the ascending colon. The polyp was       sessile. The polyp was removed with a jumbo cold forceps. Resection and       retrieval were complete.      A diminutive polyp was found in the transverse colon. The polyp was       sessile. The polyp was removed with a jumbo cold forceps. Resection and       retrieval were complete.      A diminutive polyp was found in the descending colon. The polyp was       sessile. The polyp was removed with a jumbo cold forceps. Resection and       retrieval were complete.      A diminutive polyp was found in the sigmoid colon. The polyp was       sessile. The polyp was removed with a jumbo cold forceps. Resection and       retrieval were complete.      Internal hemorrhoids were found during endoscopy. The hemorrhoids were       small and Grade I (internal hemorrhoids that do not prolapse). Impression:           - One diminutive polyp at the hepatic flexure, removed                        with a jumbo cold forceps. Resected and retrieved.                       - One diminutive polyp in the cecum, removed with a                        jumbo cold forceps. Resected and retrieved.                       - One diminutive polyp in the ascending colon, removed                        with a jumbo cold forceps. Resected and retrieved.                       - One diminutive polyp in the transverse colon, removed                        with a jumbo cold forceps. Resected and retrieved.                       - One diminutive polyp in the descending colon, removed                        with a jumbo cold forceps. Resected and retrieved.                       - One diminutive polyp in the sigmoid colon, removed                        with a jumbo cold forceps. Resected  and retrieved.                       - Internal hemorrhoids. Recommendation:       - Await pathology results. Manya Silvas, MD 06/27/2018 9:55:58  AM This report has been signed electronically. Number of Addenda: 0 Note Initiated On: 06/27/2018 9:08 AM Scope Withdrawal Time: 0 hours 13 minutes 45 seconds  Total Procedure Duration: 0 hours 23 minutes 22 seconds       [facility]

## 2018-06-28 LAB — SURGICAL PATHOLOGY

## 2018-09-07 ENCOUNTER — Telehealth: Payer: Self-pay

## 2018-09-07 NOTE — Telephone Encounter (Signed)
Call pt regarding lung screening. Left message for pt to return call.  

## 2018-09-10 ENCOUNTER — Telehealth: Payer: Self-pay | Admitting: *Deleted

## 2018-09-10 DIAGNOSIS — Z87891 Personal history of nicotine dependence: Secondary | ICD-10-CM

## 2018-09-10 DIAGNOSIS — Z122 Encounter for screening for malignant neoplasm of respiratory organs: Secondary | ICD-10-CM

## 2018-09-10 NOTE — Telephone Encounter (Signed)
Patient has been notified that annual lung cancer screening low dose CT scan is due currently or will be in near future. Confirmed that patient is within the age range of 55-77, and asymptomatic, (no signs or symptoms of lung cancer). Patient denies illness that would prevent curative treatment for lung cancer if found. Verified smoking history, (former, quit 2012, 50 pack year). The shared decision making visit was done 09/20/16. Patient is agreeable for CT scan being scheduled.

## 2018-09-11 ENCOUNTER — Other Ambulatory Visit: Payer: Self-pay | Admitting: Internal Medicine

## 2018-09-11 DIAGNOSIS — M545 Low back pain, unspecified: Secondary | ICD-10-CM

## 2018-09-11 DIAGNOSIS — G8929 Other chronic pain: Secondary | ICD-10-CM

## 2018-09-24 ENCOUNTER — Ambulatory Visit
Admission: RE | Admit: 2018-09-24 | Discharge: 2018-09-24 | Disposition: A | Discharge status: Home / Self Care | Payer: Medicare HMO | Source: Ambulatory Visit | Attending: Oncology | Admitting: Oncology

## 2018-09-24 DIAGNOSIS — Z122 Encounter for screening for malignant neoplasm of respiratory organs: Secondary | ICD-10-CM | POA: Diagnosis not present

## 2018-09-24 DIAGNOSIS — Z87891 Personal history of nicotine dependence: Secondary | ICD-10-CM | POA: Diagnosis present

## 2018-09-25 ENCOUNTER — Encounter: Payer: Self-pay | Admitting: *Deleted

## 2018-10-12 ENCOUNTER — Inpatient Hospital Stay: Admission: RE | Admit: 2018-10-12 | Payer: Medicare HMO | Source: Ambulatory Visit

## 2018-10-19 ENCOUNTER — Ambulatory Visit
Admission: RE | Admit: 2018-10-19 | Discharge: 2018-10-19 | Disposition: A | Discharge status: Home / Self Care | Payer: Medicare HMO | Source: Ambulatory Visit | Attending: Internal Medicine | Admitting: Internal Medicine

## 2018-10-19 DIAGNOSIS — M545 Low back pain, unspecified: Secondary | ICD-10-CM

## 2018-10-19 DIAGNOSIS — G8929 Other chronic pain: Secondary | ICD-10-CM

## 2018-10-19 MED ORDER — METHYLPREDNISOLONE ACETATE 40 MG/ML INJ SUSP (RADIOLOG
120.0000 mg | Freq: Once | INTRAMUSCULAR | Status: AC
  Administered 2018-10-19: 120 mg via EPIDURAL

## 2018-10-19 MED ORDER — IOPAMIDOL (ISOVUE-M 200) INJECTION 41%
1.0000 mL | Freq: Once | INTRAMUSCULAR | Status: AC
  Administered 2018-10-19: 1 mL via EPIDURAL

## 2019-06-18 ENCOUNTER — Other Ambulatory Visit
Admission: RE | Admit: 2019-06-18 | Discharge: 2019-06-18 | Disposition: A | Discharge status: Home / Self Care | Payer: Medicare HMO | Source: Ambulatory Visit | Attending: Internal Medicine | Admitting: Internal Medicine

## 2019-06-18 ENCOUNTER — Other Ambulatory Visit: Payer: Self-pay | Admitting: Internal Medicine

## 2019-06-18 ENCOUNTER — Ambulatory Visit
Admission: RE | Admit: 2019-06-18 | Discharge: 2019-06-18 | Disposition: A | Discharge status: Home / Self Care | Payer: Medicare HMO | Source: Ambulatory Visit | Attending: Internal Medicine | Admitting: Internal Medicine

## 2019-06-18 ENCOUNTER — Other Ambulatory Visit: Payer: Self-pay

## 2019-06-18 DIAGNOSIS — R7989 Other specified abnormal findings of blood chemistry: Secondary | ICD-10-CM

## 2019-06-18 DIAGNOSIS — R600 Localized edema: Secondary | ICD-10-CM

## 2019-06-18 DIAGNOSIS — R0789 Other chest pain: Secondary | ICD-10-CM | POA: Insufficient documentation

## 2019-06-18 HISTORY — DX: Malignant (primary) neoplasm, unspecified: C80.1

## 2019-06-18 LAB — FIBRIN DERIVATIVES D-DIMER (ARMC ONLY): Fibrin derivatives D-dimer (ARMC): 711 ng/mL (FEU) — ABNORMAL HIGH (ref 0.00–499.00)

## 2019-06-18 MED ORDER — IOHEXOL 350 MG/ML SOLN
75.0000 mL | Freq: Once | INTRAVENOUS | Status: AC | PRN
  Administered 2019-06-18: 75 mL via INTRAVENOUS

## 2019-06-18 MED ORDER — IOHEXOL 300 MG/ML  SOLN: 75.0000 mL | Freq: Once | INTRAMUSCULAR | Status: DC | PRN

## 2019-09-24 ENCOUNTER — Telehealth: Payer: Self-pay | Admitting: *Deleted

## 2019-10-07 ENCOUNTER — Telehealth: Payer: Self-pay | Admitting: *Deleted

## 2019-10-07 DIAGNOSIS — Z87891 Personal history of nicotine dependence: Secondary | ICD-10-CM

## 2019-10-07 NOTE — Telephone Encounter (Signed)
Patient has been notified that annual lung cancer screening low dose CT scan is due currently or will be in near future. Confirmed that patient is within the age range of 55-77, and asymptomatic, (no signs or symptoms of lung cancer). Patient denies illness that would prevent curative treatment for lung cancer if found. Verified smoking history, (former, quit 2012, 50 pack year). The shared decision making visit was done 09/20/16. Patient is agreeable for CT scan being scheduled.

## 2019-10-16 ENCOUNTER — Ambulatory Visit
Admission: RE | Admit: 2019-10-16 | Discharge: 2019-10-16 | Disposition: A | Discharge status: Home / Self Care | Payer: Medicare HMO | Source: Ambulatory Visit | Attending: Nurse Practitioner | Admitting: Nurse Practitioner

## 2019-10-16 ENCOUNTER — Other Ambulatory Visit: Payer: Self-pay

## 2019-10-16 DIAGNOSIS — Z87891 Personal history of nicotine dependence: Secondary | ICD-10-CM | POA: Insufficient documentation

## 2019-10-18 ENCOUNTER — Encounter: Payer: Self-pay | Admitting: *Deleted

## 2019-11-30 ENCOUNTER — Ambulatory Visit: Payer: Medicare HMO | Attending: Internal Medicine

## 2019-11-30 DIAGNOSIS — Z23 Encounter for immunization: Secondary | ICD-10-CM

## 2019-11-30 NOTE — Progress Notes (Signed)
   Covid-19 Vaccination Clinic  Name:  Amy Holloway    MRN: [id number] DOB: 04-Dec-1945  11/30/2019  Amy Holloway was observed post Covid-19 immunization for 15 minutes without incidence. She was provided with Vaccine Information Sheet and instruction to access the V-Safe system.   Amy Holloway was instructed to call 911 with any severe reactions post vaccine: Marland Kitchen Difficulty breathing  . Swelling of your face and throat  . A fast heartbeat  . A bad rash all over your body  . Dizziness and weakness    Immunizations Administered    Name Date Dose VIS Date Route   Pfizer COVID-19 Vaccine 11/30/2019  1:50 PM 0.3 mL 09/20/2019 Intramuscular   Manufacturer: [place]   Lot: [id number]   [place]: [id number]

## 2019-12-24 ENCOUNTER — Ambulatory Visit: Payer: Medicare HMO | Attending: Internal Medicine

## 2019-12-24 DIAGNOSIS — Z23 Encounter for immunization: Secondary | ICD-10-CM

## 2019-12-24 NOTE — Progress Notes (Signed)
   Covid-19 Vaccination Clinic  Name:  Amy Holloway    MRN: [id number] DOB: [birth year]  12/24/2019  Amy Holloway was observed post Covid-19 immunization for 15 minutes without incident. She was provided with Vaccine Information Sheet and instruction to access the V-Safe system.   Amy Holloway was instructed to call 911 with any severe reactions post vaccine: Marland Kitchen Difficulty breathing  . Swelling of face and throat  . A fast heartbeat  . A bad rash all over body  . Dizziness and weakness   Immunizations Administered    Name Date Dose VIS Date Route   Pfizer COVID-19 Vaccine 12/24/2019  1:33 PM 0.3 mL 09/20/2019 Intramuscular   Manufacturer: [place]   Lot: [id number]   [place]: [id number]

## 2020-09-25 ENCOUNTER — Other Ambulatory Visit: Payer: Self-pay | Admitting: Internal Medicine

## 2020-09-25 DIAGNOSIS — I2510 Atherosclerotic heart disease of native coronary artery without angina pectoris: Secondary | ICD-10-CM

## 2020-10-07 ENCOUNTER — Other Ambulatory Visit: Payer: Self-pay | Admitting: *Deleted

## 2020-10-07 DIAGNOSIS — Z87891 Personal history of nicotine dependence: Secondary | ICD-10-CM

## 2020-10-07 DIAGNOSIS — Z122 Encounter for screening for malignant neoplasm of respiratory organs: Secondary | ICD-10-CM

## 2020-10-07 NOTE — Progress Notes (Signed)
Contacted and scheduled for annual lung screening scan. Patient is a former smoker, quit 2012, 50 pack year history.

## 2020-10-15 ENCOUNTER — Other Ambulatory Visit: Payer: Self-pay

## 2020-10-15 ENCOUNTER — Ambulatory Visit
Admission: RE | Admit: 2020-10-15 | Discharge: 2020-10-15 | Disposition: A | Discharge status: Home / Self Care | Payer: Medicare HMO | Source: Ambulatory Visit | Attending: Nurse Practitioner | Admitting: Nurse Practitioner

## 2020-10-15 DIAGNOSIS — Z122 Encounter for screening for malignant neoplasm of respiratory organs: Secondary | ICD-10-CM | POA: Insufficient documentation

## 2020-10-15 DIAGNOSIS — Z87891 Personal history of nicotine dependence: Secondary | ICD-10-CM

## 2020-10-16 ENCOUNTER — Encounter: Payer: Self-pay | Admitting: *Deleted

## 2020-11-09 ENCOUNTER — Encounter
Admission: RE | Admit: 2020-11-09 | Discharge: 2020-11-09 | Disposition: A | Discharge status: Home / Self Care | Payer: Medicare HMO | Source: Ambulatory Visit | Attending: Internal Medicine | Admitting: Internal Medicine

## 2020-11-09 ENCOUNTER — Other Ambulatory Visit: Payer: Self-pay

## 2020-11-09 DIAGNOSIS — I2510 Atherosclerotic heart disease of native coronary artery without angina pectoris: Secondary | ICD-10-CM | POA: Insufficient documentation

## 2020-11-09 LAB — NM MYOCAR MULTI W/SPECT W/WALL MOTION / EF
Estimated workload: 1 METS
Exercise duration (min): 1 min
Exercise duration (sec): 2 s
LV dias vol: 68 mL (ref 46–106)
LV sys vol: 17 mL
Peak HR: 92 {beats}/min
Rest HR: 69 {beats}/min
SDS: 0
SRS: 4
SSS: 2
TID: 1.04

## 2020-11-09 MED ORDER — TECHNETIUM TC 99M TETROFOSMIN IV KIT
30.0000 | PACK | Freq: Once | INTRAVENOUS | Status: AC | PRN
  Administered 2020-11-09: 32.152 via INTRAVENOUS

## 2020-11-09 MED ORDER — REGADENOSON 0.4 MG/5ML IV SOLN
0.4000 mg | Freq: Once | INTRAVENOUS | Status: AC
  Administered 2020-11-09: 0.4 mg via INTRAVENOUS
  Filled 2020-11-09: qty 5

## 2020-11-09 MED ORDER — TECHNETIUM TC 99M TETROFOSMIN IV KIT
10.0000 | PACK | Freq: Once | INTRAVENOUS | Status: AC | PRN
  Administered 2020-11-09: 10.64 via INTRAVENOUS

## 2021-02-19 IMAGING — CT CT CHEST LUNG CANCER SCREENING LOW DOSE W/O CM
2 of 5 series · 15 of 40 positions shown, 18 images · non-contrast
Comparison: 10/16/2019 screening chest CT.

CLINICAL DATA: 74-year-old asymptomatic female former smoker with
50 pack-year smoking history, quit smoking 10 years prior.

EXAM:
CT CHEST WITHOUT CONTRAST LOW-DOSE FOR LUNG CANCER SCREENING
TECHNIQUE: Multidetector CT imaging of the chest was performed following the
standard protocol without IV contrast.

[Series 4: lung 1.00 · axial · 0.64mm/px · z∈[-1194,-911]mm · 12 of 313 slices shown, 15 images]
[im 15/313  mediastinal]
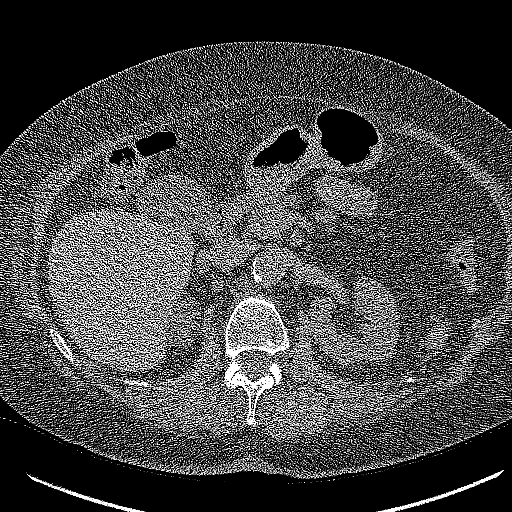
[im 15/313  lung]
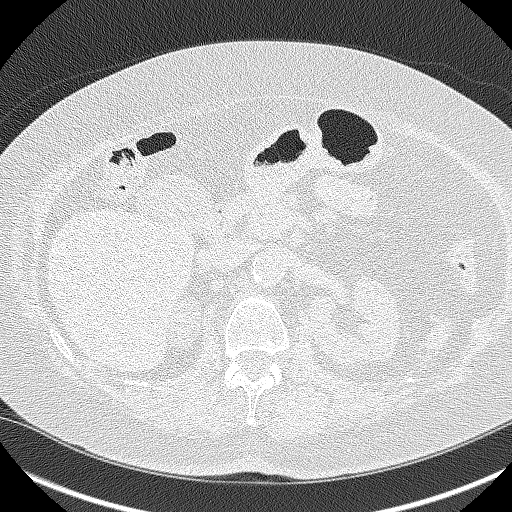
[im 45/313  lung]
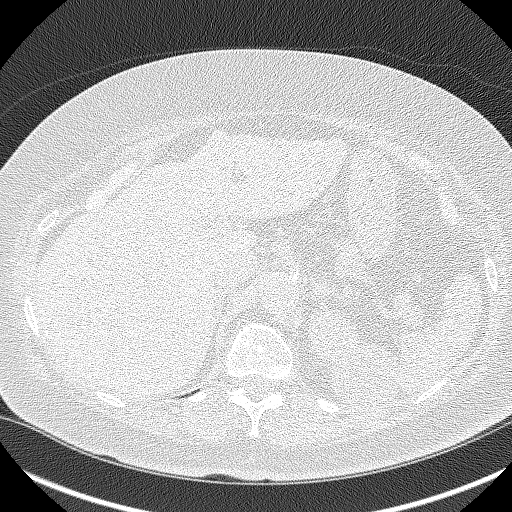
[im 75/313  lung]
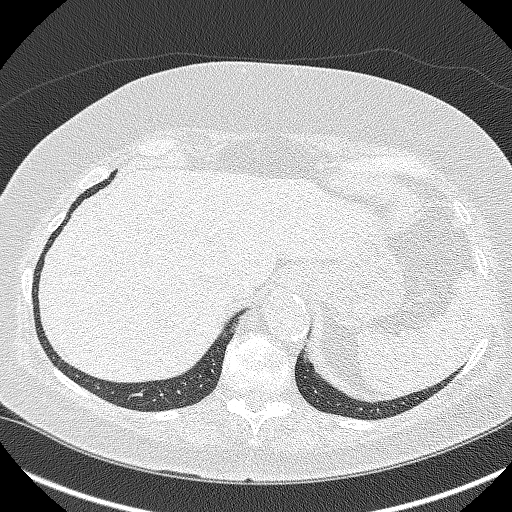
[im 90/313  lung]
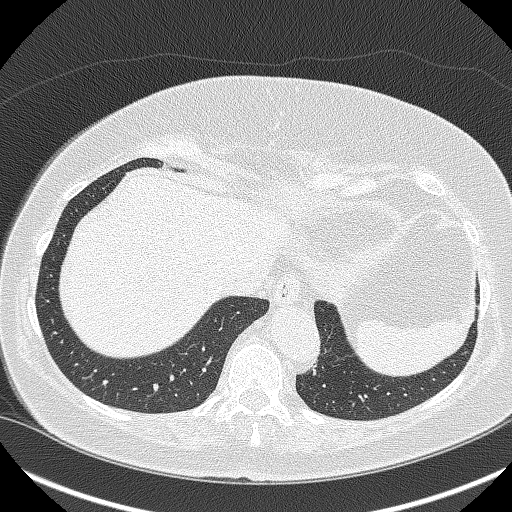
[im 119/313  mediastinal]
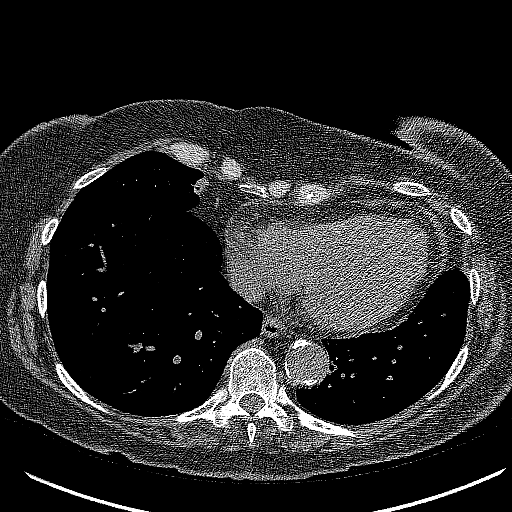
[im 119/313  lung]
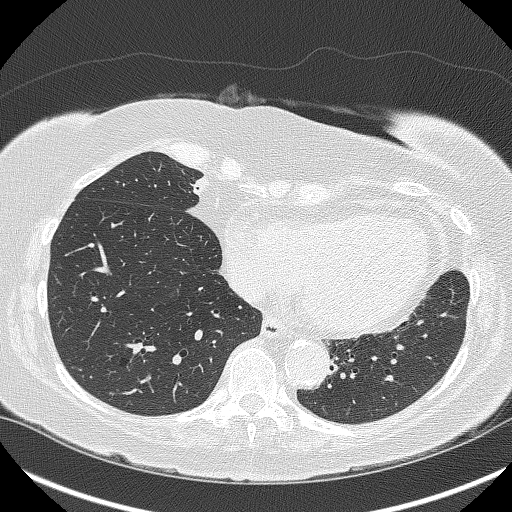
[im 149/313  lung]
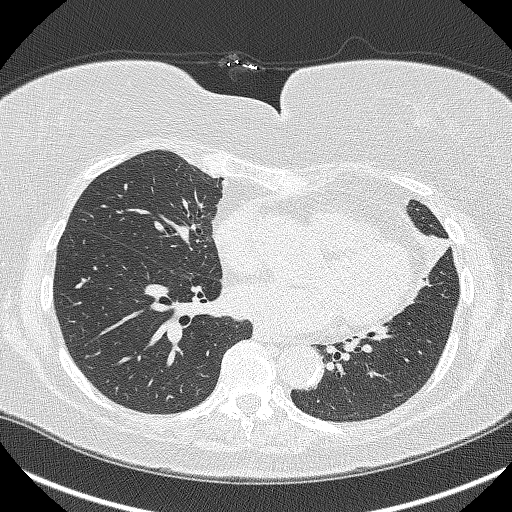
[im 164/313  lung]
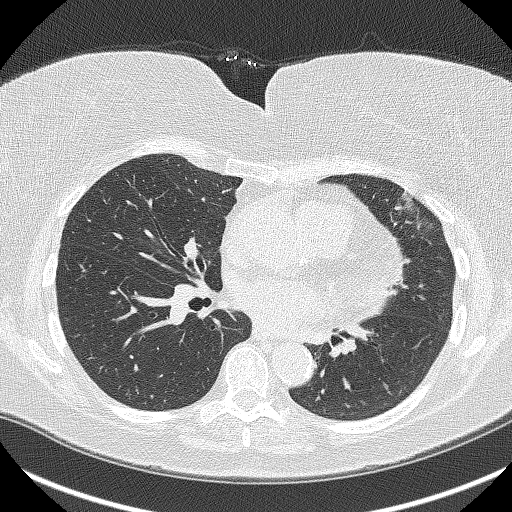
[im 194/313  lung]
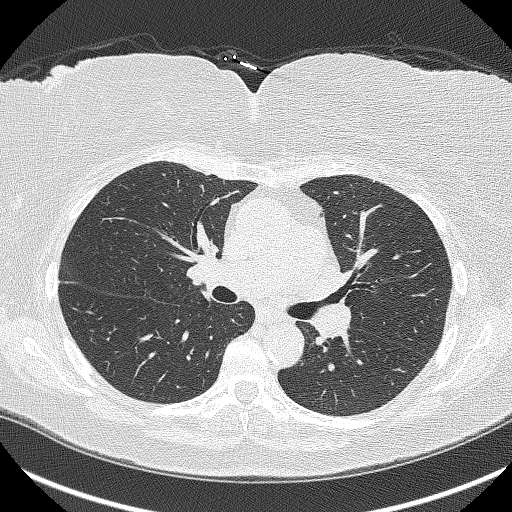
[im 223/313  mediastinal]
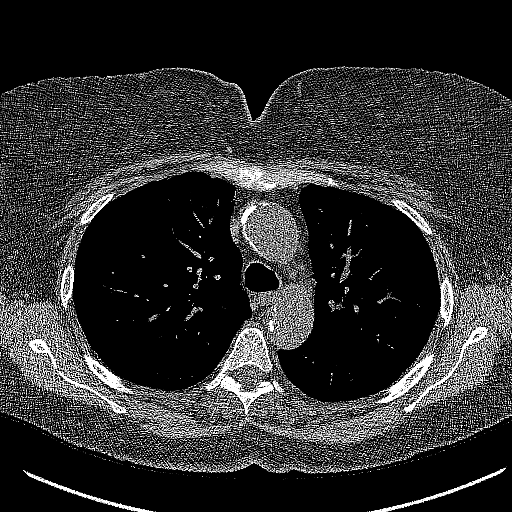
[im 223/313  lung]
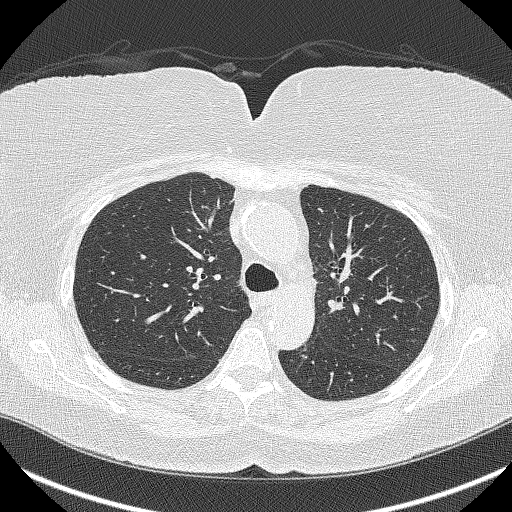
[im 238/313  lung]
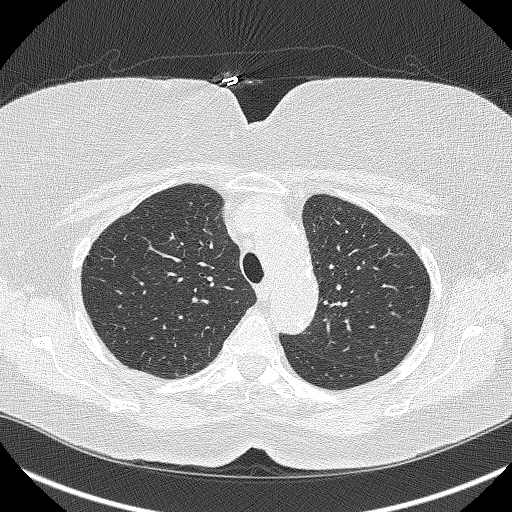
[im 268/313  lung]
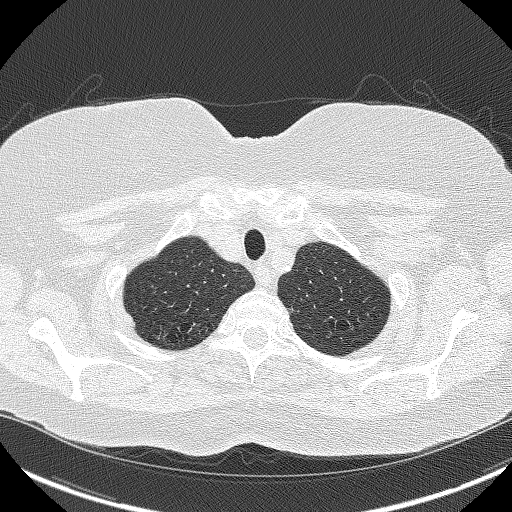
[im 298/313  lung]
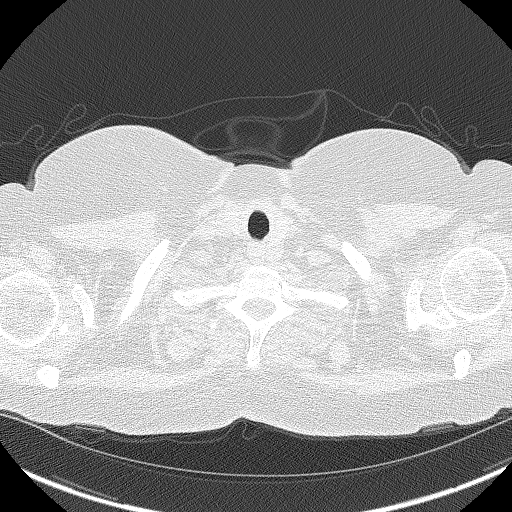

[Series 5: coronals lung 1.00 cor · coronal · 0.61mm/px · 3 of 256 slices shown]
[im 52/256  lung]
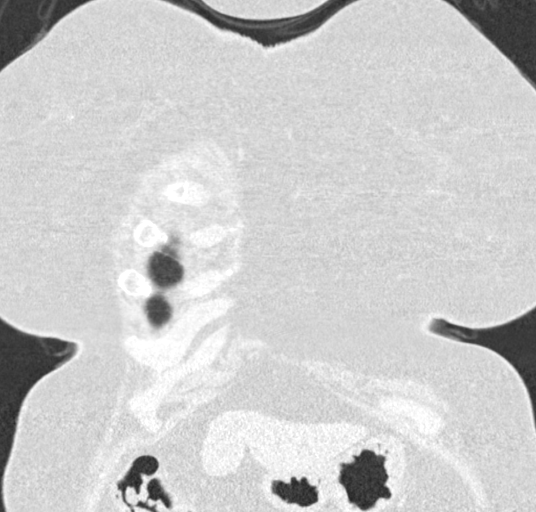
[im 103/256  lung]
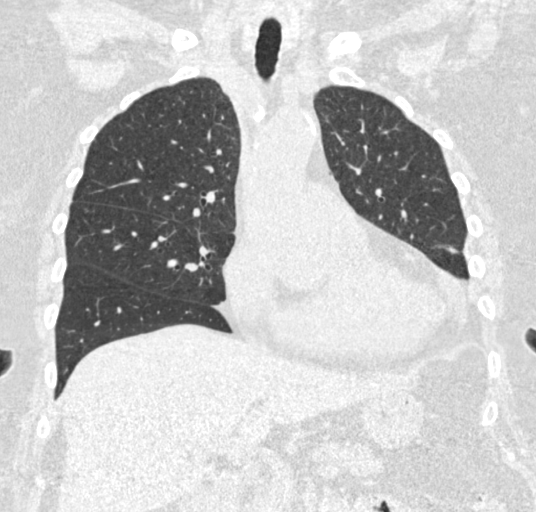
[im 154/256  lung]
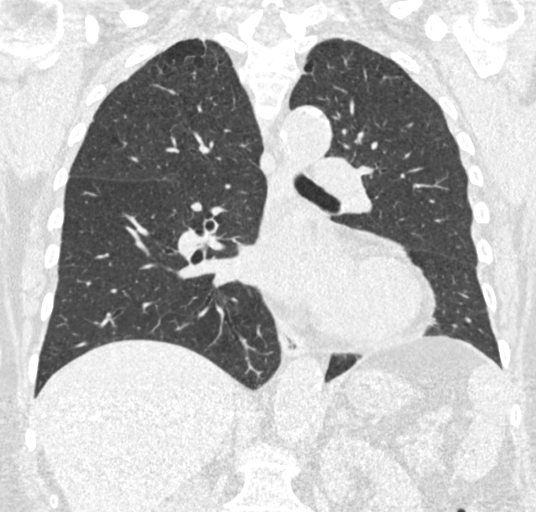

[15 of 40 positions shown; findings below may reference images not displayed]

FINDINGS: Cardiovascular: Normal heart size. Stable small pericardial
effusion/thickening. Left anterior descending and right coronary
atherosclerosis. Atherosclerotic nonaneurysmal thoracic aorta.
Normal caliber pulmonary arteries.

Mediastinum/Nodes: No discrete thyroid nodules. Unremarkable
esophagus. No pathologically enlarged axillary, mediastinal or hilar
lymph nodes, noting limited sensitivity for the detection of hilar
adenopathy on this noncontrast study.

Lungs/Pleura: No pneumothorax. No pleural effusion. Moderate
centrilobular and paraseptal emphysema with mild diffuse bronchial
wall thickening. No acute consolidative airspace disease or lung
masses. No significant growth of previously visualized scattered
small pulmonary nodules. No new significant pulmonary nodules.

Upper abdomen: Simple upper left renal cysts bilaterally, largest
4.2 cm on the left.

Musculoskeletal: No aggressive appearing focal osseous lesions.
Moderate thoracic spondylosis.
IMPRESSION: 1. Lung-RADS 2, benign appearance or behavior. Continue annual
screening with low-dose chest CT without contrast in 12 months.
2. Two vessel coronary atherosclerosis.
3. Stable small pericardial effusion/thickening.
4. Aortic Atherosclerosis ([id number]) and Emphysema ([id number].4).

## 2021-12-16 ENCOUNTER — Telehealth: Payer: Self-pay | Admitting: *Deleted

## 2021-12-16 NOTE — Telephone Encounter (Signed)
LMTC to schedule Yearly Lung CA CT Scan. 

## 2021-12-17 ENCOUNTER — Other Ambulatory Visit: Payer: Self-pay

## 2021-12-17 DIAGNOSIS — Z87891 Personal history of nicotine dependence: Secondary | ICD-10-CM

## 2021-12-31 ENCOUNTER — Other Ambulatory Visit: Payer: Self-pay

## 2021-12-31 ENCOUNTER — Ambulatory Visit
Admission: RE | Admit: 2021-12-31 | Discharge: 2021-12-31 | Disposition: A | Discharge status: Home / Self Care | Payer: Medicare HMO | Source: Ambulatory Visit | Attending: Acute Care | Admitting: Acute Care

## 2021-12-31 DIAGNOSIS — Z87891 Personal history of nicotine dependence: Secondary | ICD-10-CM | POA: Insufficient documentation

## 2022-01-05 ENCOUNTER — Telehealth: Payer: Self-pay | Admitting: Acute Care

## 2022-01-05 ENCOUNTER — Other Ambulatory Visit: Payer: Self-pay

## 2022-01-05 DIAGNOSIS — Z87891 Personal history of nicotine dependence: Secondary | ICD-10-CM

## 2022-01-05 NOTE — Telephone Encounter (Signed)
Contacted patient by phone to review results of LDCT.  No suspicious areas noted for lung cancer. Atherosclerosis and emphysema noted as previous CT.  New area on spleen noted and results faxed to PCP to review with patient.  Patient is not on statin medication. Tried it in the past but increased leg pain.  Patient will discuss findings with PCP for further recommendation.   ?

## 2022-09-22 ENCOUNTER — Other Ambulatory Visit: Payer: Self-pay | Admitting: Internal Medicine

## 2022-09-22 DIAGNOSIS — Z1231 Encounter for screening mammogram for malignant neoplasm of breast: Secondary | ICD-10-CM

## 2022-11-09 ENCOUNTER — Ambulatory Visit
Admission: RE | Admit: 2022-11-09 | Discharge: 2022-11-09 | Disposition: A | Discharge status: Home / Self Care | Payer: Medicare HMO | Source: Ambulatory Visit | Attending: Internal Medicine | Admitting: Internal Medicine

## 2022-11-09 DIAGNOSIS — Z1231 Encounter for screening mammogram for malignant neoplasm of breast: Secondary | ICD-10-CM | POA: Diagnosis not present

## 2022-12-23 ENCOUNTER — Telehealth: Payer: Self-pay | Admitting: *Deleted

## 2022-12-23 NOTE — Telephone Encounter (Signed)
Spoke with pt and advised that we were not able to get authorization for lung screening CT due to current age of 2 (Medicare guidelines). Advised pt that I will let Dr Ouida Sills know and any further follow up can be done through his office. Pt verbalized understanding.

## 2023-01-02 ENCOUNTER — Ambulatory Visit: Payer: Medicare HMO

## 2023-02-07 ENCOUNTER — Other Ambulatory Visit: Payer: Self-pay | Admitting: Orthopedic Surgery

## 2023-02-07 DIAGNOSIS — M48061 Spinal stenosis, lumbar region without neurogenic claudication: Secondary | ICD-10-CM

## 2023-02-11 ENCOUNTER — Other Ambulatory Visit: Payer: Medicare HMO

## 2023-05-01 ENCOUNTER — Other Ambulatory Visit: Payer: Self-pay | Admitting: Internal Medicine

## 2023-05-01 DIAGNOSIS — N63 Unspecified lump in unspecified breast: Secondary | ICD-10-CM

## 2023-05-09 ENCOUNTER — Ambulatory Visit
Admission: RE | Admit: 2023-05-09 | Discharge: 2023-05-09 | Disposition: A | Discharge status: Home / Self Care | Payer: Medicare HMO | Source: Ambulatory Visit | Attending: Internal Medicine | Admitting: Internal Medicine

## 2023-05-09 DIAGNOSIS — R92321 Mammographic fibroglandular density, right breast: Secondary | ICD-10-CM | POA: Diagnosis not present

## 2023-05-09 DIAGNOSIS — N63 Unspecified lump in unspecified breast: Secondary | ICD-10-CM | POA: Diagnosis present

## 2023-05-09 DIAGNOSIS — Z1239 Encounter for other screening for malignant neoplasm of breast: Secondary | ICD-10-CM | POA: Diagnosis not present

## 2023-06-02 ENCOUNTER — Other Ambulatory Visit: Payer: Self-pay

## 2023-06-02 ENCOUNTER — Emergency Department
Admission: EM | Admit: 2023-06-02 | Discharge: 2023-06-02 | Disposition: A | Discharge status: Home / Self Care | Payer: Medicare HMO | Attending: Emergency Medicine | Admitting: Emergency Medicine

## 2023-06-02 ENCOUNTER — Emergency Department: Payer: Medicare HMO

## 2023-06-02 DIAGNOSIS — R519 Headache, unspecified: Secondary | ICD-10-CM | POA: Diagnosis present

## 2023-06-02 DIAGNOSIS — J449 Chronic obstructive pulmonary disease, unspecified: Secondary | ICD-10-CM | POA: Diagnosis not present

## 2023-06-02 DIAGNOSIS — G43101 Migraine with aura, not intractable, with status migrainosus: Secondary | ICD-10-CM | POA: Insufficient documentation

## 2023-06-02 DIAGNOSIS — I10 Essential (primary) hypertension: Secondary | ICD-10-CM | POA: Diagnosis not present

## 2023-06-02 DIAGNOSIS — I2510 Atherosclerotic heart disease of native coronary artery without angina pectoris: Secondary | ICD-10-CM | POA: Diagnosis not present

## 2023-06-02 LAB — COMPREHENSIVE METABOLIC PANEL
ALT: 19 U/L (ref 0–44)
AST: 23 U/L (ref 15–41)
Albumin: 4.5 g/dL (ref 3.5–5.0)
Alkaline Phosphatase: 90 U/L (ref 38–126)
Anion gap: 10 (ref 5–15)
BUN: 20 mg/dL (ref 8–23)
CO2: 25 mmol/L (ref 22–32)
Calcium: 10.4 mg/dL — ABNORMAL HIGH (ref 8.9–10.3)
Chloride: 102 mmol/L (ref 98–111)
Creatinine, Ser: 0.71 mg/dL (ref 0.44–1.00)
GFR, Estimated: >60 mL/min (ref >60)
Glucose, Bld: 89 mg/dL (ref 70–99)
Potassium: 3.7 mmol/L (ref 3.5–5.1)
Sodium: 137 mmol/L (ref 135–145)
Total Bilirubin: 0.3 mg/dL (ref 0.3–1.2)
Total Protein: 6.9 g/dL (ref 6.5–8.1)

## 2023-06-02 LAB — URINALYSIS, ROUTINE W REFLEX MICROSCOPIC
Bilirubin Urine: NEGATIVE
Glucose, UA: NEGATIVE mg/dL
Hgb urine dipstick: NEGATIVE
Ketones, ur: NEGATIVE mg/dL
Leukocytes,Ua: NEGATIVE
Nitrite: NEGATIVE
Protein, ur: NEGATIVE mg/dL
Specific Gravity, Urine: 1.006 (ref 1.005–1.030)
pH: 6 (ref 5.0–8.0)

## 2023-06-02 LAB — CBC WITH DIFFERENTIAL/PLATELET
Abs Immature Granulocytes: 0.01 10*3/uL (ref 0.00–0.07)
Basophils Absolute: 0.1 10*3/uL (ref 0.0–0.1)
Basophils Relative: 1 %
Eosinophils Absolute: 0.1 10*3/uL (ref 0.0–0.5)
Eosinophils Relative: 2 %
HCT: 41 % (ref 36.0–46.0)
Hemoglobin: 13.6 g/dL (ref 12.0–15.0)
Immature Granulocytes: 0 %
Lymphocytes Relative: 27 %
Lymphs Abs: 2.4 10*3/uL (ref 0.7–4.0)
MCH: 30.6 pg (ref 26.0–34.0)
MCHC: 33.2 g/dL (ref 30.0–36.0)
MCV: 92.1 fL (ref 80.0–100.0)
Monocytes Absolute: 0.6 10*3/uL (ref 0.1–1.0)
Monocytes Relative: 7 %
Neutro Abs: 5.5 10*3/uL (ref 1.7–7.7)
Neutrophils Relative %: 63 %
Platelets: 284 10*3/uL (ref 150–400)
RBC: 4.45 MIL/uL (ref 3.87–5.11)
RDW: 13 % (ref 11.5–15.5)
WBC: 8.7 10*3/uL (ref 4.0–10.5)
nRBC: 0 % (ref 0.0–0.2)

## 2023-06-02 MED ORDER — IOHEXOL 350 MG/ML SOLN
75.0000 mL | Freq: Once | INTRAVENOUS | Status: AC | PRN
  Administered 2023-06-02: 75 mL via INTRAVENOUS

## 2023-06-02 MED ORDER — SODIUM CHLORIDE 0.9 % IV BOLUS
1000.0000 mL | Freq: Once | INTRAVENOUS | Status: AC
  Administered 2023-06-02: 1000 mL via INTRAVENOUS

## 2023-06-02 NOTE — ED Triage Notes (Signed)
Pt here from [facility] with a persistent headache. Pt states she can be walking and then her head will start hurting. Pt denies cp or SOB or any other symptoms.

## 2023-06-02 NOTE — ED Provider Notes (Signed)
[facility] Provider Note    Event Date/Time   First MD Initiated Contact with Patient 06/02/23 1544     (approximate)   History   Headache   HPI  Amy Holloway is a 77 y.o. female with PMH of COPD, CAD and HTN who presents for evaluation of a headache.  On Saturday night she describes smelling something burning but could not identify a source.  Then early Sunday morning she was woken up by a very sharp headache that was like a "hot poker stabbing her on the top of the head".  She states that since then the headache has come and gone multiple times, she has tried taking ibuprofen which has improved her pain some.  She states she has never had headache like this and does not usually get headaches.  Her symptoms are worse with exertion, she states that when she is sitting still her headache goes away when she gets up to walk it starts to throb.  She also states it is painful when she bends over to empty out the litter box.  She denies any fever, general malaise, one-sided weakness, visual disturbances. She does not have a headache at the time of my exam.  Patient contacted her PCP about her symptoms and he advised her to come to the ED.      Physical Exam   Triage Vital Signs: ED Triage Vitals  Encounter Vitals Group     BP 06/02/23 1507 (!) 144/63     Systolic BP Percentile --      Diastolic BP Percentile --      Pulse Rate 06/02/23 1507 81     Resp 06/02/23 1507 16     Temp 06/02/23 1508 98 F (36.7 C)     Temp Source 06/02/23 1508 Oral     SpO2 06/02/23 1507 98 %     Weight 06/02/23 1507 179 lb 14.3 oz (81.6 kg)     Height 06/02/23 1507 5\' 3"  (1.6 m)     Head Circumference --      Peak Flow --      Pain Score 06/02/23 1507 7     Pain Loc --      Pain Education --      Exclude from Growth Chart --     Most recent vital signs: Vitals:   06/02/23 1507 06/02/23 1508  BP: (!) 144/63   Pulse: 81   Resp: 16   Temp:  98 F (36.7 C)  SpO2:  98%     General: Awake, no distress.  CV:  Good peripheral perfusion. RRR. Resp:  Normal effort. CTAB. Abd:  No distention.  Other:  PERRL. EOM intact. No focal neuro deficits.   ED Results / Procedures / Treatments   Labs (all labs ordered are listed, but only abnormal results are displayed) Labs Reviewed  COMPREHENSIVE METABOLIC PANEL - Abnormal; Notable for the following components:      Result Value   Calcium 10.4 (*)    All other components within normal limits  URINALYSIS, ROUTINE W REFLEX MICROSCOPIC - Abnormal; Notable for the following components:   Color, Urine STRAW (*)    APPearance CLEAR (*)    All other components within normal limits  CBC WITH DIFFERENTIAL/PLATELET     RADIOLOGY  Noncon CT head obtained, interpreted images as well as reviewed the radiologist report.  PROCEDURES:  Critical Care performed: No  Procedures   MEDICATIONS ORDERED IN ED: Medications  sodium chloride  0.9 % bolus 1,000 mL (0 mLs Intravenous Stopped 06/02/23 1909)  iohexol (OMNIPAQUE) 350 MG/ML injection 75 mL (75 mLs Intravenous Contrast Given 06/02/23 1804)     IMPRESSION / MDM / ASSESSMENT AND PLAN / ED COURSE  I reviewed the triage vital signs and the nursing notes.                              Differential diagnosis includes, but is not limited to, migraine headache, tension headache, hypertension, intracranial bleed.  Patient's presentation is most consistent with acute complicated illness / injury requiring diagnostic workup.  Urinalysis WNL.  CBC and CMP unremarkable.  CT head obtained due to the irregularity of the patient's headache and her age.  I interpreted the images as well as reviewed the radiologist report. No acute intracranial abnormalities.   Patient's description of smelling something burning with no identifiable cause sounds to me like an aura before a migraine or possibly a prodrome before a seizure. Patient denied any seizure like activity and  stated she only had a headache.   Given the chronicity of patient's headache and it being worse with exertion, I wanted to rule out an aneurysm, so CTA was obtained.  I interpreted the images as well as reviewed the radiologist report, there is no emergent large vessel occlusion or hemodynamically significant stenosis of the head or neck.  Radiologist did note mild bilateral carotid bifurcation atherosclerosis without hemodynamically significant stenosis.  I feel patient is stable for outpatient management given her reassuring blood work and imaging.  Advised patient to take Tylenol and ibuprofen as needed for her headaches.  She can follow-up with her primary care if she continues to have symptoms.  Patient voiced understanding, all questions were answered and she was stable at discharge.     FINAL CLINICAL IMPRESSION(S) / ED DIAGNOSES   Final diagnoses:  Migraine with aura and with status migrainosus, not intractable     Rx / DC Orders   ED Discharge Orders     None        Note:  This document was prepared using Dragon voice recognition software and may include unintentional dictation errors.   Cameron Ali, PA-C 06/02/23 2020    Minna Antis, MD 06/07/23 1534

## 2023-06-02 NOTE — Discharge Instructions (Addendum)
Your blood work and your urinalysis were normal.  Your CT scans were normal and confirmed that you do not have a bleed or an aneurysm.  Continue to take Tylenol and ibuprofen as needed for pain.  These follow-up with your primary care provider if you continue to have symptoms.

## 2023-10-25 ENCOUNTER — Other Ambulatory Visit: Payer: Self-pay | Admitting: Medical Genetics

## 2023-10-28 ENCOUNTER — Other Ambulatory Visit
Admission: RE | Admit: 2023-10-28 | Discharge: 2023-10-28 | Disposition: A | Discharge status: Home / Self Care | Payer: No Typology Code available for payment source | Source: Ambulatory Visit | Attending: Medical Genetics | Admitting: Medical Genetics

## 2023-11-10 LAB — GENECONNECT MOLECULAR SCREEN: Genetic Analysis Overall Interpretation: NEGATIVE

## 2023-11-24 DIAGNOSIS — J439 Emphysema, unspecified: Secondary | ICD-10-CM | POA: Diagnosis not present

## 2023-11-24 DIAGNOSIS — E669 Obesity, unspecified: Secondary | ICD-10-CM | POA: Diagnosis not present

## 2023-11-24 DIAGNOSIS — R2681 Unsteadiness on feet: Secondary | ICD-10-CM | POA: Diagnosis not present

## 2023-11-24 DIAGNOSIS — Z008 Encounter for other general examination: Secondary | ICD-10-CM | POA: Diagnosis not present

## 2023-11-24 DIAGNOSIS — I10 Essential (primary) hypertension: Secondary | ICD-10-CM | POA: Diagnosis not present

## 2023-11-24 DIAGNOSIS — M545 Low back pain, unspecified: Secondary | ICD-10-CM | POA: Diagnosis not present

## 2023-11-24 DIAGNOSIS — F17211 Nicotine dependence, cigarettes, in remission: Secondary | ICD-10-CM | POA: Diagnosis not present

## 2023-11-24 DIAGNOSIS — E785 Hyperlipidemia, unspecified: Secondary | ICD-10-CM | POA: Diagnosis not present

## 2023-11-24 DIAGNOSIS — Z6832 Body mass index (BMI) 32.0-32.9, adult: Secondary | ICD-10-CM | POA: Diagnosis not present

## 2023-11-28 DIAGNOSIS — M4807 Spinal stenosis, lumbosacral region: Secondary | ICD-10-CM | POA: Diagnosis not present

## 2023-12-13 DIAGNOSIS — J441 Chronic obstructive pulmonary disease with (acute) exacerbation: Secondary | ICD-10-CM | POA: Diagnosis not present

## 2023-12-13 DIAGNOSIS — R930 Abnormal findings on diagnostic imaging of skull and head, not elsewhere classified: Secondary | ICD-10-CM | POA: Diagnosis not present

## 2023-12-19 ENCOUNTER — Other Ambulatory Visit: Payer: Self-pay | Admitting: Internal Medicine

## 2023-12-19 DIAGNOSIS — M899 Disorder of bone, unspecified: Secondary | ICD-10-CM

## 2023-12-26 ENCOUNTER — Ambulatory Visit
Admission: RE | Admit: 2023-12-26 | Discharge: 2023-12-26 | Disposition: A | Discharge status: Home / Self Care | Source: Ambulatory Visit | Attending: Internal Medicine | Admitting: Internal Medicine

## 2023-12-26 DIAGNOSIS — R9082 White matter disease, unspecified: Secondary | ICD-10-CM | POA: Insufficient documentation

## 2023-12-26 DIAGNOSIS — R519 Headache, unspecified: Secondary | ICD-10-CM | POA: Diagnosis not present

## 2023-12-26 DIAGNOSIS — M899 Disorder of bone, unspecified: Secondary | ICD-10-CM | POA: Diagnosis not present

## 2024-01-01 ENCOUNTER — Encounter: Payer: Self-pay | Admitting: Internal Medicine

## 2024-01-03 ENCOUNTER — Other Ambulatory Visit: Payer: Self-pay | Admitting: Internal Medicine

## 2024-01-03 DIAGNOSIS — R930 Abnormal findings on diagnostic imaging of skull and head, not elsewhere classified: Secondary | ICD-10-CM

## 2024-01-03 DIAGNOSIS — M899 Disorder of bone, unspecified: Secondary | ICD-10-CM

## 2024-03-19 DIAGNOSIS — I10 Essential (primary) hypertension: Secondary | ICD-10-CM | POA: Diagnosis not present

## 2024-03-19 DIAGNOSIS — J441 Chronic obstructive pulmonary disease with (acute) exacerbation: Secondary | ICD-10-CM | POA: Diagnosis not present

## 2024-03-19 DIAGNOSIS — R7303 Prediabetes: Secondary | ICD-10-CM | POA: Diagnosis not present

## 2024-03-26 DIAGNOSIS — T466X5A Adverse effect of antihyperlipidemic and antiarteriosclerotic drugs, initial encounter: Secondary | ICD-10-CM | POA: Diagnosis not present

## 2024-03-26 DIAGNOSIS — I2510 Atherosclerotic heart disease of native coronary artery without angina pectoris: Secondary | ICD-10-CM | POA: Diagnosis not present

## 2024-03-26 DIAGNOSIS — L989 Disorder of the skin and subcutaneous tissue, unspecified: Secondary | ICD-10-CM | POA: Diagnosis not present

## 2024-03-26 DIAGNOSIS — R7303 Prediabetes: Secondary | ICD-10-CM | POA: Diagnosis not present

## 2024-03-26 DIAGNOSIS — G720 Drug-induced myopathy: Secondary | ICD-10-CM | POA: Diagnosis not present

## 2024-03-26 DIAGNOSIS — I10 Essential (primary) hypertension: Secondary | ICD-10-CM | POA: Diagnosis not present

## 2024-03-26 DIAGNOSIS — J441 Chronic obstructive pulmonary disease with (acute) exacerbation: Secondary | ICD-10-CM | POA: Diagnosis not present

## 2024-05-02 DIAGNOSIS — D2272 Melanocytic nevi of left lower limb, including hip: Secondary | ICD-10-CM | POA: Diagnosis not present

## 2024-05-02 DIAGNOSIS — D2262 Melanocytic nevi of left upper limb, including shoulder: Secondary | ICD-10-CM | POA: Diagnosis not present

## 2024-05-02 DIAGNOSIS — L720 Epidermal cyst: Secondary | ICD-10-CM | POA: Diagnosis not present

## 2024-05-02 DIAGNOSIS — D0439 Carcinoma in situ of skin of other parts of face: Secondary | ICD-10-CM | POA: Diagnosis not present

## 2024-05-02 DIAGNOSIS — D0462 Carcinoma in situ of skin of left upper limb, including shoulder: Secondary | ICD-10-CM | POA: Diagnosis not present

## 2024-05-02 DIAGNOSIS — D225 Melanocytic nevi of trunk: Secondary | ICD-10-CM | POA: Diagnosis not present

## 2024-05-02 DIAGNOSIS — D2261 Melanocytic nevi of right upper limb, including shoulder: Secondary | ICD-10-CM | POA: Diagnosis not present

## 2024-05-02 DIAGNOSIS — D485 Neoplasm of uncertain behavior of skin: Secondary | ICD-10-CM | POA: Diagnosis not present

## 2024-05-02 DIAGNOSIS — D2271 Melanocytic nevi of right lower limb, including hip: Secondary | ICD-10-CM | POA: Diagnosis not present

## 2024-05-02 DIAGNOSIS — L821 Other seborrheic keratosis: Secondary | ICD-10-CM | POA: Diagnosis not present

## 2024-07-24 DIAGNOSIS — D0439 Carcinoma in situ of skin of other parts of face: Secondary | ICD-10-CM | POA: Diagnosis not present

## 2024-07-31 DIAGNOSIS — D0462 Carcinoma in situ of skin of left upper limb, including shoulder: Secondary | ICD-10-CM | POA: Diagnosis not present
